# Patient Record
Sex: Female | Born: 1990 | Race: Black or African American | Hispanic: No | Marital: Single | Smoking: Never smoker
Health system: Southern US, Community
[De-identification: ages and names within clinical notes are randomized; demographics above are authoritative.]

## PROBLEM LIST (undated history)

## (undated) ENCOUNTER — Inpatient Hospital Stay (HOSPITAL_COMMUNITY): Payer: Self-pay

## (undated) DIAGNOSIS — Z789 Other specified health status: Secondary | ICD-10-CM

## (undated) HISTORY — PX: NO PAST SURGERIES: SHX2092

## (undated) HISTORY — PX: TUBAL LIGATION: SHX77

---

## 2016-05-19 ENCOUNTER — Inpatient Hospital Stay (HOSPITAL_COMMUNITY)
Admission: AD | Admit: 2016-05-19 | Discharge: 2016-05-19 | Disposition: A | Discharge status: Home / Self Care | Payer: Medicaid Other | Source: Ambulatory Visit | Attending: Obstetrics and Gynecology | Admitting: Obstetrics and Gynecology

## 2016-05-19 ENCOUNTER — Inpatient Hospital Stay (HOSPITAL_COMMUNITY): Payer: Medicaid Other

## 2016-05-19 ENCOUNTER — Encounter (HOSPITAL_COMMUNITY): Payer: Self-pay | Admitting: *Deleted

## 2016-05-19 DIAGNOSIS — Z3A01 Less than 8 weeks gestation of pregnancy: Secondary | ICD-10-CM | POA: Diagnosis not present

## 2016-05-19 DIAGNOSIS — O4691 Antepartum hemorrhage, unspecified, first trimester: Secondary | ICD-10-CM | POA: Diagnosis not present

## 2016-05-19 DIAGNOSIS — O26891 Other specified pregnancy related conditions, first trimester: Secondary | ICD-10-CM | POA: Diagnosis not present

## 2016-05-19 DIAGNOSIS — R197 Diarrhea, unspecified: Secondary | ICD-10-CM | POA: Diagnosis not present

## 2016-05-19 DIAGNOSIS — O209 Hemorrhage in early pregnancy, unspecified: Secondary | ICD-10-CM | POA: Insufficient documentation

## 2016-05-19 HISTORY — DX: Other specified health status: Z78.9

## 2016-05-19 LAB — URINALYSIS, ROUTINE W REFLEX MICROSCOPIC
Bilirubin Urine: NEGATIVE
GLUCOSE, UA: NEGATIVE mg/dL
Ketones, ur: NEGATIVE mg/dL
Nitrite: NEGATIVE
PH: 5.5 (ref 5.0–8.0)
PROTEIN: 30 mg/dL — AB

## 2016-05-19 LAB — CBC WITH DIFFERENTIAL/PLATELET
BASOS ABS: 0 10*3/uL (ref 0.0–0.1)
BASOS PCT: 0 %
EOS ABS: 0 10*3/uL (ref 0.0–0.7)
Eosinophils Relative: 1 %
HEMATOCRIT: 41.4 % (ref 36.0–46.0)
HEMOGLOBIN: 14.8 g/dL (ref 12.0–15.0)
Lymphocytes Relative: 26 %
Lymphs Abs: 1.8 10*3/uL (ref 0.7–4.0)
MCH: 33.3 pg (ref 26.0–34.0)
MCHC: 35.7 g/dL (ref 30.0–36.0)
MCV: 93.2 fL (ref 78.0–100.0)
MONOS PCT: 7 %
Monocytes Absolute: 0.4 10*3/uL (ref 0.1–1.0)
NEUTROS ABS: 4.4 10*3/uL (ref 1.7–7.7)
NEUTROS PCT: 66 %
Platelets: 247 10*3/uL (ref 150–400)
RBC: 4.44 MIL/uL (ref 3.87–5.11)
RDW: 12.5 % (ref 11.5–15.5)
WBC: 6.7 10*3/uL (ref 4.0–10.5)

## 2016-05-19 LAB — URINE MICROSCOPIC-ADD ON

## 2016-05-19 LAB — HCG, QUANTITATIVE, PREGNANCY: HCG, BETA CHAIN, QUANT, S: 16597 m[IU]/mL — AB (ref <5)

## 2016-05-19 LAB — WET PREP, GENITAL
SPERM: NONE SEEN
Trich, Wet Prep: NONE SEEN
Yeast Wet Prep HPF POC: NONE SEEN

## 2016-05-19 LAB — POCT PREGNANCY, URINE: PREG TEST UR: POSITIVE — AB

## 2016-05-19 NOTE — MAU Note (Signed)
Pt presents to MAU with complaints of vaginal spotting, dizziness, diarrhea and numbness ( entire body).

## 2016-05-19 NOTE — MAU Provider Note (Signed)
CSN: [id number]     Arrival date & time 05/19/16  1534 History   None    Chief Complaint  Patient presents with  . Diarrhea  . Vaginal Bleeding  . Dizziness  . Numbness     (Consider location/radiation/quality/duration/timing/severity/associated sxs/prior Treatment) Vaginal Bleeding The patient's primary symptoms include vaginal bleeding. This is a new problem. The current episode started yesterday. The problem occurs intermittently. The problem has been unchanged. The pain is mild. She is pregnant. Associated symptoms include abdominal pain, diarrhea and nausea. Pertinent negatives include no painful intercourse. The vaginal discharge was bloody. The vaginal bleeding is spotting. She has not been passing clots. She has not been passing tissue. She has tried nothing for the symptoms. She uses nothing for contraception.   Karen Zavala is a 25 y.o. G2P1001 @ [redacted]w[redacted]d gestation who presents to the MAU with spotting that started 4 days ago and diarrhea that started yesterday.  Current sex partner x 6 months, no hx of STI's, no BC, last pap smear one year ago and was normal.  Patient reports that the spotting started 4 days ago and has been off and on. Patient reports that she was having a problem with constipation until yesterday when she started having diarrhea. She reports having 2 watery brown stools today. Mild lower abdominal cramping.  Patient is getting PNC in [facility] but works in [place]. She has not had an ultrasound with this pregnancy.   Past Medical History  Diagnosis Date  . Medical history non-contributory    Past Surgical History  Procedure Laterality Date  . No past surgeries     Family History  Problem Relation Age of Onset  . Alcohol abuse Neg Hx    Social History  Substance Use Topics  . Smoking status: Never Smoker   . Smokeless tobacco: None  . Alcohol Use: No   OB History    Gravida Para Term Preterm AB TAB SAB Ectopic Multiple Living   Review of Systems  Gastrointestinal: Positive for nausea, abdominal pain and diarrhea.  Genitourinary: Positive for vaginal bleeding.  all other systems negative    Allergies  Review of patient's allergies indicates no known allergies.  Home Medications   Prior to Admission medications   Medication Sig Start Date End Date Taking? Authorizing Provider  Prenatal Vit-Fe Fumarate-FA (PRENATAL MULTIVITAMIN) TABS tablet Take 1 tablet by mouth daily at 12 noon.   Yes Historical Provider, MD   BP 125/71 mmHg  Pulse 68  Temp(Src) 98.7 F (37.1 C)  Resp 16  Wt 250 lb (113.399 kg)  LMP 04/06/2016 Physical Exam  Constitutional: She is oriented to person, place, and time. She appears well-developed and well-nourished. No distress.  HENT:  Head: Normocephalic.  Eyes: Conjunctivae and EOM are normal.  Neck: Normal range of motion. Neck supple.  Cardiovascular: Normal rate and regular rhythm.   Pulmonary/Chest: Effort normal and breath sounds normal.  Abdominal: Soft. Bowel sounds are normal. There is no tenderness.  Genitourinary:  External genitalia without lesions, frothy blood tinged d/c vaginal vault. Cervix inflamed, no CMT, no adnexal tenderness. Uterus slightly enlarged.   Musculoskeletal: Normal range of motion.  Neurological: She is alert and oriented to person, place, and time. No cranial nerve deficit.  Skin: Skin is warm and dry.  Psychiatric: She has a normal mood and affect. Her behavior is normal.  Nursing note and vitals reviewed.   ED Course  Procedures (including critical care time) Labs Review Labs Reviewed  WET PREP, GENITAL - Abnormal; Notable for the following:    Clue Cells Wet Prep HPF POC PRESENT (*)    WBC, Wet Prep HPF POC MODERATE (*)    All other components within normal limits  URINALYSIS, ROUTINE W REFLEX MICROSCOPIC (NOT [facility]) - Abnormal; Notable for the following:    Color, Urine AMBER (*)    Specific Gravity, Urine >1.030 (*)    Hgb urine  dipstick MODERATE (*)    Protein, ur 30 (*)    Leukocytes, UA TRACE (*)    All other components within normal limits  HCG, QUANTITATIVE, PREGNANCY - Abnormal; Notable for the following:    hCG, Beta Chain, Quant, S 16597 (*)    All other components within normal limits  URINE MICROSCOPIC-ADD ON - Abnormal; Notable for the following:    Squamous Epithelial / LPF 6-30 (*)    Bacteria, UA MANY (*)    All other components within normal limits  POCT PREGNANCY, URINE - Abnormal; Notable for the following:    Preg Test, Ur POSITIVE (*)    All other components within normal limits  CBC WITH DIFFERENTIAL/PLATELET  RPR  HIV ANTIBODY (ROUTINE TESTING)  ABO/RH  GC/CHLAMYDIA PROBE AMP ([place]) NOT AT [facility]    Imaging Review 6 week 1 day IUP with cardiac activity  I have personally reviewed and evaluated these images and lab results as part of my medical decision-making.   MDM  25 y.o. G2P1001 @ 4712w1d gestation with vaginal bleeding and diarrhea stable for d/c without acute abdomen and only scant bleeding noted. Viable IUP. Discussed with the patient clinical, lab and u/s finding and plan of care. All questioned fully answered. She will f/u with her OB. Instructions given for first trimester bleeding and diarrhea. Patient feeling better after PO hydration and crackers. No v/d during MAU visit.    Work note given.  Final diagnoses:  Vaginal bleeding in pregnancy, first trimester

## 2016-05-19 NOTE — Discharge Instructions (Signed)
Your ultrasound today shows that you are 6 weeks 1 day pregnant. Follow up with your OB for continued prenatal care.  Follow the instruction regarding vaginal bleeding in pregnancy and information on diarrhea.   Vaginal Bleeding During Pregnancy, First Trimester A small amount of bleeding (spotting) from the vagina is common in early pregnancy. Sometimes the bleeding is normal and is not a problem, and sometimes it is a sign of something serious. Be sure to tell your doctor about any bleeding from your vagina right away. HOME CARE  Watch your condition for any changes.  Follow your doctor's instructions about how active you can be.  If you are on bed rest:  You may need to stay in bed and only get up to use the bathroom.  You may be allowed to do some activities.  If you need help, make plans for someone to help you.  Write down:  The number of pads you use each day.  How often you change pads.  How soaked (saturated) your pads are.  Do not use tampons.  Do not douche.  Do not have sex or orgasms until your doctor says it is okay.  If you pass any tissue from your vagina, save the tissue so you can show it to your doctor.  Only take medicines as told by your doctor.  Do not take aspirin because it can make you bleed.  Keep all follow-up visits as told by your doctor. GET HELP IF:   You bleed from your vagina.  You have cramps.  You have labor pains.  You have a fever that does not go away after you take medicine. GET HELP RIGHT AWAY IF:   You have very bad cramps in your back or belly (abdomen).  You pass large clots or tissue from your vagina.  You bleed more.  You feel light-headed or weak.  You pass out (faint).  You have chills.  You are leaking fluid or have a gush of fluid from your vagina.  You pass out while pooping (having a bowel movement). MAKE SURE YOU:  Understand these instructions.  Will watch your condition.  Will get help right  away if you are not doing well or get worse.   This information is not intended to replace advice given to you by your health care provider. Make sure you discuss any questions you have with your health care provider.   Document Released: 04/23/2014 Document Reviewed: 04/23/2014 Elsevier Interactive Patient Education [employer]2016 Elsevier Inc.

## 2016-05-20 LAB — HIV ANTIBODY (ROUTINE TESTING W REFLEX): HIV SCREEN 4TH GENERATION: NONREACTIVE

## 2016-05-20 LAB — RPR: RPR Ser Ql: NONREACTIVE

## 2016-05-20 LAB — GC/CHLAMYDIA PROBE AMP (~~LOC~~) NOT AT ARMC
Chlamydia: NEGATIVE
Neisseria Gonorrhea: NEGATIVE

## 2016-05-20 LAB — ABO/RH: ABO/RH(D): O POS

## 2016-07-14 ENCOUNTER — Inpatient Hospital Stay (HOSPITAL_COMMUNITY)
Admission: AD | Admit: 2016-07-14 | Discharge: 2016-07-14 | Disposition: A | Discharge status: Home / Self Care | Payer: Medicaid Other | Source: Ambulatory Visit | Attending: Obstetrics & Gynecology | Admitting: Obstetrics & Gynecology

## 2016-07-14 ENCOUNTER — Encounter (HOSPITAL_COMMUNITY): Payer: Self-pay | Admitting: *Deleted

## 2016-07-14 DIAGNOSIS — N949 Unspecified condition associated with female genital organs and menstrual cycle: Secondary | ICD-10-CM | POA: Diagnosis not present

## 2016-07-14 DIAGNOSIS — Z3A14 14 weeks gestation of pregnancy: Secondary | ICD-10-CM | POA: Diagnosis not present

## 2016-07-14 DIAGNOSIS — O26851 Spotting complicating pregnancy, first trimester: Secondary | ICD-10-CM | POA: Insufficient documentation

## 2016-07-14 DIAGNOSIS — O26891 Other specified pregnancy related conditions, first trimester: Secondary | ICD-10-CM | POA: Insufficient documentation

## 2016-07-14 DIAGNOSIS — O9989 Other specified diseases and conditions complicating pregnancy, childbirth and the puerperium: Secondary | ICD-10-CM

## 2016-07-14 DIAGNOSIS — R102 Pelvic and perineal pain: Secondary | ICD-10-CM | POA: Insufficient documentation

## 2016-07-14 LAB — URINALYSIS, ROUTINE W REFLEX MICROSCOPIC
Bilirubin Urine: NEGATIVE
Glucose, UA: NEGATIVE mg/dL
HGB URINE DIPSTICK: NEGATIVE
Ketones, ur: NEGATIVE mg/dL
Nitrite: NEGATIVE
PROTEIN: NEGATIVE mg/dL
pH: 6 (ref 5.0–8.0)

## 2016-07-14 LAB — WET PREP, GENITAL
SPERM: NONE SEEN
TRICH WET PREP: NONE SEEN
YEAST WET PREP: NONE SEEN

## 2016-07-14 LAB — URINE MICROSCOPIC-ADD ON

## 2016-07-14 MED ORDER — IBUPROFEN 800 MG PO TABS
800.0000 mg | ORAL_TABLET | Freq: Once | ORAL | Status: AC
  Administered 2016-07-14: 800 mg via ORAL
  Filled 2016-07-14: qty 1

## 2016-07-14 NOTE — MAU Note (Signed)
PT SAYS   AT 0900- SHE  STARTED FEELING  CRAMPING-  THEN  SPOTTING - WHEN  SHE  WIPED-  LIGHT  PINK.  Marland Kitchen   CRAMPING  WORSE AT 730PM-     AND  BACK HURTS.      LESS SPOTTING- SOME  BROWN.      LAST SEX-   2  MTHS   AGO.     PNC-       [place]    -    WORKS  [place]ERE - IN [place].

## 2016-07-14 NOTE — MAU Provider Note (Signed)
History     CSN: [id number]  Arrival date and time: 07/14/16 2040   First Provider Initiated Contact with Patient 07/14/16 2145      Chief Complaint  Patient presents with  . Pelvic Pain   Pelvic Pain  The patient's primary symptoms include pelvic pain and vaginal bleeding. This is a new problem. The current episode started today. The problem occurs intermittently. The problem has been unchanged. Pain severity now: 5/10  The problem affects both sides. She is pregnant. Associated symptoms include abdominal pain. Pertinent negatives include no chills, constipation, diarrhea, dysuria, fever, frequency, nausea, urgency or vomiting. The vaginal discharge was normal. The vaginal bleeding is spotting (Had spotting this morning, but it has since stopped. ). She has not been passing clots. She has not been passing tissue. The symptoms are aggravated by activity. She has tried nothing for the symptoms. Her menstrual history has been regular (LMP 04/06/16 ).     Past Medical History:  Diagnosis Date  . Medical history non-contributory     Past Surgical History:  Procedure Laterality Date  . NO PAST SURGERIES      Family History  Problem Relation Age of Onset  . Alcohol abuse Neg Hx     Social History  Substance Use Topics  . Smoking status: Never Smoker  . Smokeless tobacco: Never Used  . Alcohol use No    Allergies: No Known Allergies  Prescriptions Prior to Admission  Medication Sig Dispense Refill Last Dose  . Prenatal Vit-Fe Fumarate-FA (PRENATAL MULTIVITAMIN) TABS tablet Take 1 tablet by mouth daily at 12 noon.   07/14/2016 at Unknown time    Review of Systems  Constitutional: Negative for chills and fever.  Gastrointestinal: Positive for abdominal pain. Negative for constipation, diarrhea, nausea and vomiting.  Genitourinary: Positive for pelvic pain. Negative for dysuria, frequency and urgency.   Physical Exam   Blood pressure 101/56, pulse 64, temperature 99.1 F  (37.3 C), temperature source Oral, resp. rate 20, height 5\' 7"  (1.702 m), weight 251 lb 12 oz (114.2 kg), last menstrual period 04/06/2016.  Physical Exam  Nursing note and vitals reviewed. Constitutional: She is oriented to person, place, and time. She appears well-developed and well-nourished. No distress.  HENT:  Head: Normocephalic.  Cardiovascular: Normal rate.   Respiratory: Effort normal.  GI: Soft. There is no tenderness. There is no rebound.  Genitourinary:  Genitourinary Comments: Cervix: closed/thick FHT 155 with doppler   Neurological: She is alert and oriented to person, place, and time.  Skin: Skin is warm and dry.  Psychiatric: She has a normal mood and affect.   Results for orders placed or performed during the hospital encounter of 07/14/16 (from the past 24 hour(s))  Urinalysis, Routine w reflex microscopic (not at [facility])     Status: Abnormal   Collection Time: 07/14/16  9:13 PM  Result Value Ref Range   Color, Urine YELLOW YELLOW   APPearance CLEAR CLEAR   Specific Gravity, Urine >1.030 (H) 1.005 - 1.030   pH 6.0 5.0 - 8.0   Glucose, UA NEGATIVE NEGATIVE mg/dL   Hgb urine dipstick NEGATIVE NEGATIVE   Bilirubin Urine NEGATIVE NEGATIVE   Ketones, ur NEGATIVE NEGATIVE mg/dL   Protein, ur NEGATIVE NEGATIVE mg/dL   Nitrite NEGATIVE NEGATIVE   Leukocytes, UA SMALL (A) NEGATIVE  Urine microscopic-add on     Status: Abnormal   Collection Time: 07/14/16  9:13 PM  Result Value Ref Range   Squamous Epithelial / LPF 0-5 (A) NONE SEEN  WBC, UA 0-5 0 - 5 WBC/hpf   RBC / HPF 0-5 0 - 5 RBC/hpf   Bacteria, UA FEW (A) NONE SEEN  Wet prep, genital     Status: Abnormal   Collection Time: 07/14/16  9:50 PM  Result Value Ref Range   Yeast Wet Prep HPF POC NONE SEEN NONE SEEN   Trich, Wet Prep NONE SEEN NONE SEEN   Clue Cells Wet Prep HPF POC PRESENT (A) NONE SEEN   WBC, Wet Prep HPF POC FEW (A) NONE SEEN   Sperm NONE SEEN     MAU Course  Procedures  MDM Patient has  had ibuprofen. She reports that her pain is better.   Assessment and Plan   1. Round ligament pain   2. [redacted] weeks gestation of pregnancy    DC home Comfort measures reviewed  2nd Trimester precautions  RX: none  Return to MAU as needed FU with OB as planned  Follow-up Information    [facility] .   Specialty:  Obstetrics and Gynecology Why:  As scheduled  Contact information: [address] [place] [address] [phone]           Tawnya Crook 07/14/2016, 9:46 PM

## 2016-07-14 NOTE — Discharge Instructions (Signed)

## 2016-07-15 LAB — GC/CHLAMYDIA PROBE AMP (~~LOC~~) NOT AT ARMC
Chlamydia: NEGATIVE
Neisseria Gonorrhea: NEGATIVE

## 2016-10-16 ENCOUNTER — Encounter (HOSPITAL_BASED_OUTPATIENT_CLINIC_OR_DEPARTMENT_OTHER): Payer: Self-pay | Admitting: *Deleted

## 2016-10-16 ENCOUNTER — Emergency Department (HOSPITAL_BASED_OUTPATIENT_CLINIC_OR_DEPARTMENT_OTHER)
Admission: EM | Admit: 2016-10-16 | Discharge: 2016-10-16 | Disposition: A | Discharge status: Home / Self Care | Payer: Medicaid Other | Attending: Emergency Medicine | Admitting: Emergency Medicine

## 2016-10-16 DIAGNOSIS — Z3A28 28 weeks gestation of pregnancy: Secondary | ICD-10-CM | POA: Diagnosis not present

## 2016-10-16 DIAGNOSIS — N760 Acute vaginitis: Secondary | ICD-10-CM

## 2016-10-16 DIAGNOSIS — O2313 Infections of bladder in pregnancy, third trimester: Secondary | ICD-10-CM | POA: Diagnosis not present

## 2016-10-16 DIAGNOSIS — O23599 Infection of other part of genital tract in pregnancy, unspecified trimester: Secondary | ICD-10-CM

## 2016-10-16 DIAGNOSIS — O23593 Infection of other part of genital tract in pregnancy, third trimester: Secondary | ICD-10-CM | POA: Insufficient documentation

## 2016-10-16 DIAGNOSIS — N3000 Acute cystitis without hematuria: Secondary | ICD-10-CM

## 2016-10-16 LAB — URINALYSIS, ROUTINE W REFLEX MICROSCOPIC
Bilirubin Urine: NEGATIVE
GLUCOSE, UA: NEGATIVE mg/dL
Hgb urine dipstick: NEGATIVE
KETONES UR: NEGATIVE mg/dL
NITRITE: POSITIVE — AB
PROTEIN: NEGATIVE mg/dL
Specific Gravity, Urine: 1.015 (ref 1.005–1.030)
pH: 7 (ref 5.0–8.0)

## 2016-10-16 LAB — URINE MICROSCOPIC-ADD ON

## 2016-10-16 MED ORDER — CEPHALEXIN 500 MG PO CAPS: 500.0000 mg | ORAL_CAPSULE | Freq: Two times a day (BID) | ORAL | 0 refills | Status: DC

## 2016-10-16 MED ORDER — CLOTRIMAZOLE 1 % VA CREA: 1.0000 | TOPICAL_CREAM | Freq: Every day | VAGINAL | 0 refills | Status: DC

## 2016-10-16 NOTE — ED Notes (Signed)
PA at bedside.

## 2016-10-16 NOTE — ED Triage Notes (Signed)
Pt reports 4-5 days of vaginal rectal irritation. States it burns when she urinates and urine touches the skin. Reports vaginal irritation to her labia. Pt is 6-7 months pregnant. Denies prior similar symptoms

## 2016-10-16 NOTE — ED Provider Notes (Signed)
[facility] Provider Note   CSN: [id number] Arrival date & time: 10/16/16  1649  By signing my name below, I, Christy Sartorius, attest that this documentation has been prepared under the direction and in the presence of  Aviva Wolfer Camprubi-Soms, PA-C. Electronically Signed: Christy Sartorius, ED Scribe. 10/16/16. 5:37 PM.  History   Chief Complaint Chief Complaint  Patient presents with  . Vaginal Itching   The history is provided by the patient and medical records. No language interpreter was used.  Vaginal Itching  This is a new problem. The current episode started more than 2 days ago. The problem occurs constantly. The problem has not changed since onset.Pertinent negatives include no chest pain, no abdominal pain and no shortness of breath. Exacerbated by: urination. Nothing relieves the symptoms. She has tried nothing for the symptoms. The treatment provided no relief.    HPI Comments:  Karen Zavala is a 25 y.o. female currently almost [redacted] weeks pregnant receiving prenatal care at [facility], who presents to the Emergency Department complaining of vaginal pruritis that begins on her labia majora and goes towards her rectum ongoing x1 wk.  Pt states her symptoms began after using lavare soap 1 week ago.  Pt has returned to her normal soap but reports her itching persists.  She notes some irritation of the area with urination due to the urine touching her skin, but denies dysuria.  She denies vaginal discharge/bleeding, genital sores, dysuria, hematuria, fever, chills, chest pain, SOB, abdominal pain, n/v/d/c, numbness, tingling and weakness.  Pt is sexually active, unprotected, with one female partner. She denies use of new condoms or feminine products and recent antibiotics. LMP was 04/06/16.  Past Medical History:  Diagnosis Date  . Medical history non-contributory     There are no active problems to display for this patient.   Past Surgical History:  Procedure  Laterality Date  . NO PAST SURGERIES      OB History    Gravida Para Term Preterm AB Living   2 1 1     1    SAB TAB Ectopic Multiple Live Births           1       Home Medications    Prior to Admission medications   Medication Sig Start Date End Date Taking? Authorizing Provider  Prenatal Vit-Fe Fumarate-FA (PRENATAL MULTIVITAMIN) TABS tablet Take 1 tablet by mouth daily at 12 noon.    Historical Provider, MD    Family History Family History  Problem Relation Age of Onset  . Alcohol abuse Neg Hx     Social History Social History  Substance Use Topics  . Smoking status: Never Smoker  . Smokeless tobacco: Never Used  . Alcohol use No     Allergies   Review of patient's allergies indicates no known allergies.   Review of Systems Review of Systems  Constitutional: Negative for chills and fever.  Respiratory: Negative for shortness of breath.   Cardiovascular: Negative for chest pain.  Gastrointestinal: Negative for abdominal pain, constipation, diarrhea, nausea and vomiting.  Genitourinary: Positive for vaginal pain (itching). Negative for dysuria, genital sores, hematuria, vaginal bleeding and vaginal discharge.  Musculoskeletal: Negative for arthralgias and myalgias.  Skin: Negative for color change.  Allergic/Immunologic: Negative for immunocompromised state.  Neurological: Negative for weakness and numbness.  Psychiatric/Behavioral: Negative for confusion.  10 Systems reviewed and are negative for acute change except as noted in the HPI.    Physical Exam Updated Vital Signs BP 119/71  Pulse 79   Temp 97.9 F (36.6 C) (Oral)   Resp 18   Ht 5\' 7"  (1.702 m)   Wt 260 lb (117.9 kg)   LMP 04/06/2016   SpO2 100%   BMI 40.72 kg/m   Physical Exam  Constitutional: She is oriented to person, place, and time. Vital signs are normal. She appears well-developed and well-nourished.  Non-toxic appearance. No distress.  Afebrile, nontoxic, NAD  HENT:  Head:  Normocephalic and atraumatic.  Mouth/Throat: Oropharynx is clear and moist and mucous membranes are normal.  Eyes: Conjunctivae and EOM are normal. Right eye exhibits no discharge. Left eye exhibits no discharge.  Neck: Normal range of motion. Neck supple.  Cardiovascular: Normal rate, regular rhythm, normal heart sounds and intact distal pulses.  Exam reveals no gallop and no friction rub.   No murmur heard. Pulmonary/Chest: Effort normal and breath sounds normal. No respiratory distress. She has no decreased breath sounds. She has no wheezes. She has no rhonchi. She has no rales.  Abdominal: Soft. Normal appearance and bowel sounds are normal. She exhibits no distension. There is no tenderness. There is no rigidity, no rebound, no guarding, no CVA tenderness, no tenderness at McBurney's point and negative Murphy's sign.  Soft, gravid to the level above the umbilicus, NTND, +BS throughout, no r/g/r, neg murphy's, neg mcburney's, no CVA TTP.  Musculoskeletal: Normal range of motion.  Neurological: She is alert and oriented to person, place, and time. She has normal strength. No sensory deficit.  Skin: Skin is warm, dry and intact. No rash noted.  Psychiatric: She has a normal mood and affect.  Nursing note and vitals reviewed.   ED Treatments / Results   DIAGNOSTIC STUDIES:  Oxygen Saturation is 100% on RA, NML by my interpretation.    COORDINATION OF CARE:  5:34 PM Discussed treatment plan with pt at bedside and pt agreed to plan.  Labs (all labs ordered are listed, but only abnormal results are displayed) Labs Reviewed  URINALYSIS, ROUTINE W REFLEX MICROSCOPIC (NOT AT [facility]) - Abnormal; Notable for the following:       Result Value   APPearance CLOUDY (*)    Nitrite POSITIVE (*)    Leukocytes, UA LARGE (*)    All other components within normal limits  URINE MICROSCOPIC-ADD ON - Abnormal; Notable for the following:    Squamous Epithelial / LPF 0-5 (*)    Bacteria, UA MANY (*)     All other components within normal limits  URINE CULTURE    EKG  EKG Interpretation None       Radiology No results found.  Procedures Procedures (including critical care time)  Medications Ordered in ED Medications - No data to display   Initial Impression / Assessment and Plan / ED Course  I have reviewed the triage vital signs and the nursing notes.  Pertinent labs & imaging results that were available during my care of the patient were reviewed by me and considered in my medical decision making (see chart for details).  Clinical Course    25 y.o. female here with vulvar irritation since switching soaps. No discharge, no abd pain/tenderness. Pt is [redacted]wks pregnant, receiving prenatal care [facility]. Discussed that given that she's pregnant, want to minimize vaginal exams, therefore will empirically cover with clotimazole vaginal cream for yeast vaginitis given symptoms, but will not obtain pelvic exam (especially given lack of abdominal pain and discharge). Will get U/A to ensure no UTI or other complicating factor, and  then reassess shortly.  6:04 PM U/A nitrite+ with large leuks, 0-5 squamous, TNTC WBC, and many bacteria, consistent with UTI. Will treat with keflex x7 days; culture sent. Will also send home with rx for gyne-lotrimin. Discussed switching back to old soap. F/up with OBGYN in 3-5 days for recheck. Sitz baths discussed. I explained the diagnosis and have given explicit precautions to return to the ER including for any other new or worsening symptoms. The patient understands and accepts the medical plan as it's been dictated and I have answered their questions. Discharge instructions concerning home care and prescriptions have been given. The patient is STABLE and is discharged to home in good condition.   Final Clinical Impressions(s) / ED Diagnoses   Final diagnoses:  Vaginitis complicating current pregnancy  Acute cystitis without hematuria     New Prescriptions New Prescriptions   CEPHALEXIN (KEFLEX) 500 MG CAPSULE    Take 1 capsule (500 mg total) by mouth 2 (two) times daily. x 7 days   CLOTRIMAZOLE (GYNE-LOTRIMIN) 1 % VAGINAL CREAM    Place 1 Applicatorful vaginally at bedtime. X 7 days   I personally performed the services described in this documentation, which was scribed in my presence. The recorded information has been reviewed and is accurate.     Allen Derry, PA-C 10/16/16 1805    Laurence Spates, MD 10/21/16 [phone]

## 2016-10-16 NOTE — Discharge Instructions (Addendum)
Use clotrimazole cream as directed for 1 week to treat a vaginal yeast infection. Switch back to your prior soap to avoid continued irritation. Perform warm sitz baths to help with irritation of the vaginal area.  You have a urinary tract infection. Stay very well hydrated with plenty of water throughout the day. Take antibiotic until completed. Use tylenol as needed for pain. Follow up with your OBGYN in the next 5 days for recheck of symptoms. [facility] for changes or worsening symptoms.  Please seek immediate care if you develop the following: You develop back pain.  Your symptoms are no better, or worse in 3 days. There is severe back pain or lower abdominal pain.  You develop chills.  You have a fever.  There is nausea or vomiting.  There is continued burning or discomfort with urination.

## 2016-10-18 LAB — URINE CULTURE: Culture: >=100000 — AB

## 2016-10-19 ENCOUNTER — Telehealth (HOSPITAL_BASED_OUTPATIENT_CLINIC_OR_DEPARTMENT_OTHER): Payer: Self-pay | Admitting: Emergency Medicine

## 2016-10-19 NOTE — Telephone Encounter (Signed)
Post ED Visit - Positive Culture Follow-up  Culture report reviewed by antimicrobial stewardship pharmacist:  []  Enzo BiNathan Batchelder, Pharm.D. []  Celedonio MiyamotoJeremy Frens, [address] BCPS []  Garvin FilaMike Maccia, Pharm.D. []  Georgina PillionElizabeth Martin, Pharm.D., BCPS []  EkalakaMinh Pham, [address]D., BCPS, AAHIVP []  Estella HuskMichelle Turner, Pharm.D., BCPS, AAHIVP []  Tennis Mustassie Stewart, Pharm.D. []  Sherle Poeob Vincent, [address]Lonna Cobb. T[address] Stone PharmD  Positive urine culture Treated with cephalexin, organism sensitive to the same and no further patient follow-up is required at this time.  Berle MullMiller, Anees Vanecek 10/19/2016, 10:07 AM

## 2016-11-22 ENCOUNTER — Inpatient Hospital Stay (HOSPITAL_COMMUNITY)
Admission: AD | Admit: 2016-11-22 | Discharge: 2016-11-22 | Disposition: A | Discharge status: Home / Self Care | Payer: Medicaid Other | Source: Ambulatory Visit | Attending: Obstetrics & Gynecology | Admitting: Obstetrics & Gynecology

## 2016-11-22 ENCOUNTER — Encounter (HOSPITAL_COMMUNITY): Payer: Self-pay

## 2016-11-22 DIAGNOSIS — Z3A32 32 weeks gestation of pregnancy: Secondary | ICD-10-CM | POA: Diagnosis not present

## 2016-11-22 DIAGNOSIS — O26893 Other specified pregnancy related conditions, third trimester: Secondary | ICD-10-CM | POA: Diagnosis not present

## 2016-11-22 DIAGNOSIS — R109 Unspecified abdominal pain: Secondary | ICD-10-CM | POA: Diagnosis not present

## 2016-11-22 LAB — URINALYSIS, ROUTINE W REFLEX MICROSCOPIC
Bilirubin Urine: NEGATIVE
Glucose, UA: NEGATIVE mg/dL
HGB URINE DIPSTICK: NEGATIVE
Ketones, ur: NEGATIVE mg/dL
Leukocytes, UA: NEGATIVE
NITRITE: NEGATIVE
PROTEIN: NEGATIVE mg/dL
SPECIFIC GRAVITY, URINE: 1.025 (ref 1.005–1.030)
pH: 6.5 (ref 5.0–8.0)

## 2016-11-22 NOTE — MAU Provider Note (Signed)
History   G2P1001 @ 32.6 wks in with c/o abd pain and cramping. [facility] [place] in [place] so she came here. States good fetal movement.  CSN: [id number]  Arrival date & time 11/22/16  1657   None     Chief Complaint  Patient presents with  . Abdominal Pain  . Back Pain  . pelvic pressure    HPI  Past Medical History:  Diagnosis Date  . Medical history non-contributory     Past Surgical History:  Procedure Laterality Date  . NO PAST SURGERIES      Family History  Problem Relation Age of Onset  . Alcohol abuse Neg Hx     Social History  Substance Use Topics  . Smoking status: Never Smoker  . Smokeless tobacco: Never Used  . Alcohol use No    OB History    Gravida Para Term Preterm AB Living   2 1 1     1    SAB TAB Ectopic Multiple Live Births           1      Review of Systems  Constitutional: Negative.   HENT: Negative.   Eyes: Negative.   Respiratory: Negative.   Cardiovascular: Negative.   Gastrointestinal: Positive for abdominal pain.  Endocrine: Negative.   Genitourinary: Negative.   Musculoskeletal: Negative.   Skin: Negative.   Allergic/Immunologic: Negative.   Neurological: Negative.   Hematological: Negative.   Psychiatric/Behavioral: Negative.     Allergies  Patient has no known allergies.  Home Medications    BP 126/65 (BP Location: Right Arm)   Pulse 86   Temp 98 F (36.7 C) (Oral)   Resp 18   LMP 04/06/2016   Physical Exam  Constitutional: She is oriented to person, place, and time. She appears well-developed and well-nourished.  HENT:  Head: Normocephalic.  Eyes: Pupils are equal, round, and reactive to light.  Neck: Normal range of motion.  Cardiovascular: Normal rate, regular rhythm, normal heart sounds and intact distal pulses.   Pulmonary/Chest: Effort normal and breath sounds normal.  Abdominal: Soft. Bowel sounds are normal.  Genitourinary: Vagina normal and uterus normal.   Musculoskeletal: Normal range of motion.  Neurological: She is alert and oriented to person, place, and time. She has normal reflexes.  Skin: Skin is warm and dry.  Psychiatric: She has a normal mood and affect. Her behavior is normal. Judgment and thought content normal.    MAU Course  Procedures (including critical care time)  Labs Reviewed  URINALYSIS, ROUTINE W REFLEX MICROSCOPIC (NOT AT [facility])   No results found.   No diagnosis found.    MDM  Fetal heart rate pattern reassuring, SVE post/th/ft/blts. Pt states she was a FT last week in office which is no change. No uc's. Urinalysis WNL will d/c home to f/u with provider as needed.

## 2016-11-22 NOTE — MAU Note (Signed)
Pt C/O lower back pain, lower abdominal cramping, pelvic pressure for the last 2-3 days.  Had pos "preterm labor test" on Wednesday.  Received BMZ on Thursday & Friday.  Was dilated 1 dm on Thursday.  [facility].  Denies bleeding or LOF today.

## 2016-12-10 ENCOUNTER — Encounter (HOSPITAL_COMMUNITY): Payer: Self-pay | Admitting: *Deleted

## 2016-12-10 ENCOUNTER — Inpatient Hospital Stay (HOSPITAL_COMMUNITY)
Admission: AD | Admit: 2016-12-10 | Discharge: 2016-12-10 | Disposition: A | Discharge status: Home / Self Care | Payer: Medicaid Other | Source: Ambulatory Visit | Attending: Obstetrics & Gynecology | Admitting: Obstetrics & Gynecology

## 2016-12-10 DIAGNOSIS — Z3A35 35 weeks gestation of pregnancy: Secondary | ICD-10-CM | POA: Diagnosis not present

## 2016-12-10 DIAGNOSIS — O26893 Other specified pregnancy related conditions, third trimester: Secondary | ICD-10-CM | POA: Insufficient documentation

## 2016-12-10 DIAGNOSIS — N898 Other specified noninflammatory disorders of vagina: Secondary | ICD-10-CM | POA: Insufficient documentation

## 2016-12-10 LAB — WET PREP, GENITAL
Clue Cells Wet Prep HPF POC: NONE SEEN
Sperm: NONE SEEN
Trich, Wet Prep: NONE SEEN
Yeast Wet Prep HPF POC: NONE SEEN

## 2016-12-10 LAB — POCT FERN TEST: POCT FERN TEST: NEGATIVE

## 2016-12-10 NOTE — Discharge Instructions (Signed)

## 2016-12-10 NOTE — MAU Provider Note (Signed)
History     CSN: [id number]  Arrival date and time: 12/10/16 [id number]  Discharge    Chief Complaint  Patient presents with  . Rupture of Membranes   Patient is a 25 year old G2 P1 at 35 weeks and 3 days who presents today with concern for ruptured membranes. She had initial wet prep performed by the nurse is negative and I have been called to evaluate further for rupture membrane. She reports that she woke up this morning her panties were moist. She put a new pair on and it too was moist. She reports its a clear is discharge. No foul odor. She denies any abdominal pain. She denies any bleeding. She denies contractions.    OB History    Gravida Para Term Preterm AB Living   2 1 1     1    SAB TAB Ectopic Multiple Live Births           1      Past Medical History:  Diagnosis Date  . Medical history non-contributory     Past Surgical History:  Procedure Laterality Date  . NO PAST SURGERIES      Family History  Problem Relation Age of Onset  . Alcohol abuse Neg Hx     Social History  Substance Use Topics  . Smoking status: Never Smoker  . Smokeless tobacco: Never Used  . Alcohol use No    Allergies: No Known Allergies  Prescriptions Prior to Admission  Medication Sig Dispense Refill Last Dose  . cephALEXin (KEFLEX) 500 MG capsule Take 1 capsule (500 mg total) by mouth 2 (two) times daily. x 7 days (Patient not taking: Reported on 12/10/2016) 14 capsule 0 Completed Course at Unknown time  . clotrimazole (GYNE-LOTRIMIN) 1 % vaginal cream Place 1 Applicatorful vaginally at bedtime. X 7 days (Patient not taking: Reported on 12/10/2016) 45 g 0 Completed Course at Unknown time    Review of Systems  Constitutional: Negative for chills and fever.  Respiratory: Negative for cough and shortness of breath.   Cardiovascular: Negative for chest pain and palpitations.  Gastrointestinal: Negative for abdominal pain, diarrhea, heartburn, nausea and vomiting.  Genitourinary:  Negative for dysuria, frequency and urgency.   Physical Exam   Blood pressure 100/57, pulse 80, resp. rate 18, height 5\' 7"  (1.702 m), weight 276 lb 1.9 oz (125.2 kg), last menstrual period 04/06/2016.  Physical Exam  Constitutional: She is oriented to person, place, and time. She appears well-developed and well-nourished.  Cardiovascular: Normal rate and intact distal pulses.   Respiratory: Effort normal. No respiratory distress.  GI: Soft. Bowel sounds are normal. She exhibits no distension. There is no tenderness. There is no rebound.  Genitourinary:  Genitourinary Comments: Copious clear discharge in the vagina, healthy pink mucosa, cervix visually closed.  Neurological: She is alert and oriented to person, place, and time.  Skin: Skin is warm and dry.  Psychiatric: She has a normal mood and affect.    MAU Course  Procedures  MDM In MA U patient underwent continuous fetal monitoring which showed a reactive NST with a heart rate of 145. No contractions were picked up on tocometry. Patient had a wet prep which revealed no abnormalities. Gonorrhea and chlamydia was sent. Denies any urinary symptoms. Patient's cervix was checked and is 1.5/thick/ -3  Assessment and Plan  #1: Vaginal discharge: Likely physiologic reassured patient's it does not appear to be amniotic fluid nor bacterial vaginosis infection. Will await the results of final gonorrhea and chlamydia. Okay  to DC home.  Ernestina Pennaicholas Schenk 12/10/2016, 11:57 AM

## 2016-12-10 NOTE — MAU Note (Signed)
Pt stated when she woke up this morning her underwear was wet. Pt c/o lower back pain and period like painas well. Good fetal movement. Gets her care in [place].

## 2016-12-11 LAB — GC/CHLAMYDIA PROBE AMP (~~LOC~~) NOT AT ARMC
CHLAMYDIA, DNA PROBE: NEGATIVE
NEISSERIA GONORRHEA: NEGATIVE

## 2017-03-24 ENCOUNTER — Encounter (HOSPITAL_COMMUNITY): Payer: Self-pay

## 2017-06-18 ENCOUNTER — Emergency Department (HOSPITAL_BASED_OUTPATIENT_CLINIC_OR_DEPARTMENT_OTHER): Payer: BLUE CROSS/BLUE SHIELD

## 2017-06-18 ENCOUNTER — Emergency Department (HOSPITAL_BASED_OUTPATIENT_CLINIC_OR_DEPARTMENT_OTHER)
Admission: EM | Admit: 2017-06-18 | Discharge: 2017-06-18 | Disposition: A | Discharge status: Home / Self Care | Payer: BLUE CROSS/BLUE SHIELD | Attending: Emergency Medicine | Admitting: Emergency Medicine

## 2017-06-18 ENCOUNTER — Encounter (HOSPITAL_BASED_OUTPATIENT_CLINIC_OR_DEPARTMENT_OTHER): Payer: Self-pay | Admitting: Emergency Medicine

## 2017-06-18 DIAGNOSIS — N83201 Unspecified ovarian cyst, right side: Secondary | ICD-10-CM | POA: Diagnosis not present

## 2017-06-18 DIAGNOSIS — Z975 Presence of (intrauterine) contraceptive device: Secondary | ICD-10-CM | POA: Insufficient documentation

## 2017-06-18 DIAGNOSIS — R109 Unspecified abdominal pain: Secondary | ICD-10-CM | POA: Diagnosis present

## 2017-06-18 LAB — URINALYSIS, ROUTINE W REFLEX MICROSCOPIC
BILIRUBIN URINE: NEGATIVE
Glucose, UA: NEGATIVE mg/dL
Hgb urine dipstick: NEGATIVE
KETONES UR: NEGATIVE mg/dL
Leukocytes, UA: NEGATIVE
NITRITE: NEGATIVE
Protein, ur: NEGATIVE mg/dL
Specific Gravity, Urine: 1.029 (ref 1.005–1.030)
pH: 5.5 (ref 5.0–8.0)

## 2017-06-18 LAB — WET PREP, GENITAL
Sperm: NONE SEEN
TRICH WET PREP: NONE SEEN
Yeast Wet Prep HPF POC: NONE SEEN

## 2017-06-18 LAB — PREGNANCY, URINE: Preg Test, Ur: NEGATIVE

## 2017-06-18 MED ORDER — KETOROLAC TROMETHAMINE 60 MG/2ML IM SOLN
60.0000 mg | Freq: Once | INTRAMUSCULAR | Status: AC
  Administered 2017-06-18: 60 mg via INTRAMUSCULAR
  Filled 2017-06-18: qty 2

## 2017-06-18 MED ORDER — DICYCLOMINE HCL 10 MG PO CAPS
10.0000 mg | ORAL_CAPSULE | Freq: Once | ORAL | Status: AC
  Administered 2017-06-18: 10 mg via ORAL
  Filled 2017-06-18: qty 1

## 2017-06-18 NOTE — ED Notes (Addendum)
Patient is not in the room at this time.   She has been wanting to go home before I gave her her med,  Bentyl.  Patient left prior to AVS.

## 2017-06-18 NOTE — ED Notes (Signed)
Patient seen walking down the hallway and not stopping for assistance. Patient did not stop at registration nor as she was called

## 2017-06-18 NOTE — ED Provider Notes (Signed)
[facility]-EMERGENCY DEPT Provider Note   CSN: [id number] Arrival date & time: 06/18/17  1007     History   Chief Complaint Chief Complaint  Patient presents with  . Abdominal Cramping    HPI Karen Zavala is a 26 y.o. female who presents with 2 days of Gradually worsening abdominal cramping. Patient states that the pain originates in her lower pelvic and radiates to her abdomen bilaterally. Patient states that she has been taking ibuprofen with mild temporary relief but states that the cramping returns. She has not tried any other medications or home remedies. She denies any other alleviating factor. She denies any aggravating factors. Patient states that she describes similar episode of symptoms therefore rescheduled within her OB/GYN at that time. Patient had pelvic done revealing no STDs. Patient reports her last menstrual cycle was 05/21/17. Patient is currently sexually active with one partner and does not use protection. She denies any history of STD infections. Patient denies any fever, nausea/vomiting, chest pain, difficulty breathing, diarrhea, constipation, dysuria, hematuria, vaginal bleeding, vaginal discharge.  The history is provided by the patient.    Past Medical History:  Diagnosis Date  . Medical history non-contributory     There are no active problems to display for this patient.   Past Surgical History:  Procedure Laterality Date  . NO PAST SURGERIES      OB History    Gravida Para Term Preterm AB Living   2 1 1     1    SAB TAB Ectopic Multiple Live Births           1       Home Medications    Prior to Admission medications   Not on File    Family History Family History  Problem Relation Age of Onset  . Alcohol abuse Neg Hx     Social History Social History  Substance Use Topics  . Smoking status: Never Smoker  . Smokeless tobacco: Never Used  . Alcohol use No     Allergies   Patient has no known allergies.   Review of Systems Review  of Systems  Constitutional: Negative for fever.  Respiratory: Negative for cough and shortness of breath.   Cardiovascular: Negative for chest pain.  Gastrointestinal: Positive for abdominal pain. Negative for constipation, diarrhea, nausea and vomiting.  Genitourinary: Negative for dysuria and hematuria.  Musculoskeletal: Negative for back pain.  Neurological: Negative for headaches.     Physical Exam Updated Vital Signs BP 110/68 (BP Location: Right Arm)   Pulse 60   Temp 98.4 F (36.9 C) (Oral)   Resp 18   Ht 5\' 7"  (1.702 m)   Wt 123 kg (271 lb 2.7 oz)   LMP 05/21/2017 (LMP Unknown)   SpO2 100%   BMI 42.47 kg/m   Physical Exam  Constitutional: She is oriented to person, place, and time. She appears well-developed and well-nourished.  Sitting comfortably on examination table  HENT:  Head: Normocephalic and atraumatic.  Mouth/Throat: Oropharynx is clear and moist and mucous membranes are normal.  Eyes: Conjunctivae, EOM and lids are normal. Pupils are equal, round, and reactive to light.  Neck: Full passive range of motion without pain.  Cardiovascular: Normal rate, regular rhythm, normal heart sounds and normal pulses.  Exam reveals no gallop and no friction rub.   No murmur heard. Pulmonary/Chest: Effort normal and breath sounds normal.  Abdominal: Soft. Normal appearance and bowel sounds are normal. There is generalized tenderness. There is no rigidity, no guarding and  no CVA tenderness.  Abdomen soft, with no distention. She has generalized tenderness to the entire abdomen. No focal tenderness. CVA tenderness bilaterally.  Genitourinary: Vagina normal and uterus normal. Cervix exhibits no motion tenderness and no friability. Right adnexum displays no mass and no tenderness. Left adnexum displays no mass.  Genitourinary Comments: The exam was performed with a chaperone present. Normal external female genitalia with no external lesions or rashes. Patient has some very small  amount of blood tinged mucus in vaginal canal that may be indicative the patient is getting her period. No CMT. No adnexal mass or tenderness noted. No uterine tenderness.  Musculoskeletal: Normal range of motion.  Neurological: She is alert and oriented to person, place, and time.  Skin: Skin is warm and dry. Capillary refill takes less than 2 seconds.  Psychiatric: She has a normal mood and affect. Her speech is normal.  Nursing note and vitals reviewed.    ED Treatments / Results  Labs (all labs ordered are listed, but only abnormal results are displayed) Labs Reviewed  WET PREP, GENITAL - Abnormal; Notable for the following:       Result Value   Clue Cells Wet Prep HPF POC PRESENT (*)    WBC, Wet Prep HPF POC MANY (*)    All other components within normal limits  PREGNANCY, URINE  URINALYSIS, ROUTINE W REFLEX MICROSCOPIC  GC/CHLAMYDIA PROBE AMP ([place]) NOT AT [facility]    EKG  EKG Interpretation None       Radiology US Transvaginal Non-ob  Result Date: 06/18/2017 CLINICAL DATA:  26 year old female with 2 days of generalized pelvic pain and cramping. IUD placed February 2018. Uncertain LMP. EXAM: TRANSABDOMINAL AND TRANSVAGINAL ULTRASOUND OF PELVIS DOPPLER ULTRASOUND OF OVARIES TECHNIQUE: Both transabdominal and transvaginal ultrasound examinations of the pelvis were performed. Transabdominal technique was performed for global imaging of the pelvis including uterus, ovaries, adnexal regions, and pelvic cul-de-sac. It was necessary to proceed with endovaginal exam following the transabdominal exam to visualize the endometrium and adnexa. Color and duplex Doppler ultrasound was utilized to evaluate blood flow to the ovaries. COMPARISON:  No prior non obstetric pelvic sonogram. FINDINGS: Uterus Measurements: 9.7 x 4.3 x 7.4 cm. Anteverted mildly enlarged uterus. No uterine fibroids or other myometrial abnormalities. Endometrium Thickness: 11 mm. Intrauterine device is malpositioned,  located in the lower uterine segment and endocervical canal. No endometrial cavity fluid or focal endometrial mass demonstrated. Right ovary Measurements: 6.4 x 4.1 x 4.3 cm. There is a 4.5 x 3.4 x 3.8 cm complex right ovarian cyst with heterogeneous internal echoes and no internal vascularity, compatible with a right ovarian hemorrhagic cyst. No suspicious right ovarian or right adnexal masses. Left ovary Measurements: 4.0 x 3.9 x 3.0 cm. Normal appearance/no adnexal mass. Pulsed Doppler evaluation of both ovaries demonstrates normal low-resistance arterial and venous waveforms. Other findings There is a moderate volume of nonspecific heterogeneous material distending the pelvic cul-de-sac. IMPRESSION: 1. No evidence of adnexal torsion. 2. Right ovarian 4.5 cm hemorrhagic cyst. 3. Moderate volume nonspecific heterogeneous material distending the pelvic cul-de-sac, favored to represent a hemoperitoneum/blood products probably from recent right ovarian hemorrhagic cyst rupture. 4. Malpositioned IUD, located in the endocervical canal/ lower uterine segment. Alternative means of contraception is necessary until the IUD can be removed and replaced. Electronically Signed   By: Delbert Phenix M.D.   On: 06/18/2017 12:52   US Pelvis Complete  Result Date: 06/18/2017 CLINICAL DATA:  26 year old female with 2 days of generalized pelvic pain and  cramping. IUD placed February 2018. Uncertain LMP. EXAM: TRANSABDOMINAL AND TRANSVAGINAL ULTRASOUND OF PELVIS DOPPLER ULTRASOUND OF OVARIES TECHNIQUE: Both transabdominal and transvaginal ultrasound examinations of the pelvis were performed. Transabdominal technique was performed for global imaging of the pelvis including uterus, ovaries, adnexal regions, and pelvic cul-de-sac. It was necessary to proceed with endovaginal exam following the transabdominal exam to visualize the endometrium and adnexa. Color and duplex Doppler ultrasound was utilized to evaluate blood flow to the  ovaries. COMPARISON:  No prior non obstetric pelvic sonogram. FINDINGS: Uterus Measurements: 9.7 x 4.3 x 7.4 cm. Anteverted mildly enlarged uterus. No uterine fibroids or other myometrial abnormalities. Endometrium Thickness: 11 mm. Intrauterine device is malpositioned, located in the lower uterine segment and endocervical canal. No endometrial cavity fluid or focal endometrial mass demonstrated. Right ovary Measurements: 6.4 x 4.1 x 4.3 cm. There is a 4.5 x 3.4 x 3.8 cm complex right ovarian cyst with heterogeneous internal echoes and no internal vascularity, compatible with a right ovarian hemorrhagic cyst. No suspicious right ovarian or right adnexal masses. Left ovary Measurements: 4.0 x 3.9 x 3.0 cm. Normal appearance/no adnexal mass. Pulsed Doppler evaluation of both ovaries demonstrates normal low-resistance arterial and venous waveforms. Other findings There is a moderate volume of nonspecific heterogeneous material distending the pelvic cul-de-sac. IMPRESSION: 1. No evidence of adnexal torsion. 2. Right ovarian 4.5 cm hemorrhagic cyst. 3. Moderate volume nonspecific heterogeneous material distending the pelvic cul-de-sac, favored to represent a hemoperitoneum/blood products probably from recent right ovarian hemorrhagic cyst rupture. 4. Malpositioned IUD, located in the endocervical canal/ lower uterine segment. Alternative means of contraception is necessary until the IUD can be removed and replaced. Electronically Signed   By: Delbert Phenix M.D.   On: 06/18/2017 12:52   [place] Art/ven Flow Abd Pelv Doppler  Result Date: 06/18/2017 CLINICAL DATA:  26 year old female with 2 days of generalized pelvic pain and cramping. IUD placed February 2018. Uncertain LMP. EXAM: TRANSABDOMINAL AND TRANSVAGINAL ULTRASOUND OF PELVIS DOPPLER ULTRASOUND OF OVARIES TECHNIQUE: Both transabdominal and transvaginal ultrasound examinations of the pelvis were performed. Transabdominal technique was performed for global imaging of  the pelvis including uterus, ovaries, adnexal regions, and pelvic cul-de-sac. It was necessary to proceed with endovaginal exam following the transabdominal exam to visualize the endometrium and adnexa. Color and duplex Doppler ultrasound was utilized to evaluate blood flow to the ovaries. COMPARISON:  No prior non obstetric pelvic sonogram. FINDINGS: Uterus Measurements: 9.7 x 4.3 x 7.4 cm. Anteverted mildly enlarged uterus. No uterine fibroids or other myometrial abnormalities. Endometrium Thickness: 11 mm. Intrauterine device is malpositioned, located in the lower uterine segment and endocervical canal. No endometrial cavity fluid or focal endometrial mass demonstrated. Right ovary Measurements: 6.4 x 4.1 x 4.3 cm. There is a 4.5 x 3.4 x 3.8 cm complex right ovarian cyst with heterogeneous internal echoes and no internal vascularity, compatible with a right ovarian hemorrhagic cyst. No suspicious right ovarian or right adnexal masses. Left ovary Measurements: 4.0 x 3.9 x 3.0 cm. Normal appearance/no adnexal mass. Pulsed Doppler evaluation of both ovaries demonstrates normal low-resistance arterial and venous waveforms. Other findings There is a moderate volume of nonspecific heterogeneous material distending the pelvic cul-de-sac. IMPRESSION: 1. No evidence of adnexal torsion. 2. Right ovarian 4.5 cm hemorrhagic cyst. 3. Moderate volume nonspecific heterogeneous material distending the pelvic cul-de-sac, favored to represent a hemoperitoneum/blood products probably from recent right ovarian hemorrhagic cyst rupture. 4. Malpositioned IUD, located in the endocervical canal/ lower uterine segment. Alternative means of contraception is necessary until  the IUD can be removed and replaced. Electronically Signed   By: Delbert PhenixJason A Poff M.D.   On: 06/18/2017 12:52    Procedures Procedures (including critical care time)  Medications Ordered in ED Medications  ketorolac (TORADOL) injection 60 mg (60 mg Intramuscular  Given 06/18/17 1136)  dicyclomine (BENTYL) capsule 10 mg (10 mg Oral Given 06/18/17 1304)     Initial Impression / Assessment and Plan / ED Course  I have reviewed the triage vital signs and the nursing notes.  Pertinent labs & imaging results that were available during my care of the patient were reviewed by me and considered in my medical decision making (see chart for details).     26 year old female who presents with 2 days of abdominal cramping. No other symptoms. Patient is afebrile, non-toxic appearing, sitting comfortably on examination table. Vital signs reviewed and stable. Patient's last menstrual cycle was 05/21/17. Consider menstrual cycle abdominal cramping versus ovarian pathology. History/physical exam are not concerning for acute infectious etiology such as appendicitis, diverticulitis, colitis. Will check UA and Urine pregnancy for evaluation. Will give analgesics in the department.   UA reviewed. Negative for any signs of infection. Urine pregnancy negative. Discussed results with patient. Ultrasound pending. Pelvic documented above. Wet prep pending.  Reevaluation after ultrasound. Patient had some improvement with Toradol use but is still having some cramping. Requesting some more pain medication. Will give additional analgesics in the department. Ultrasound results pending.  Ultrasound reviewed. Negative for any ovarian torsion. Patient does have a right-sided 4.5 cm hemorrhagic cyst. Also noted is a malpositioned IUD located at the endocervical canal/lower uterine segment. We'll plan to have patient follow-up with her OB/GYN.  Went to discuss results with patient's but she is not in the room. A nurse outpatient walking in the department but does not know where she went. Will plan to await the patient returns room.  Patient is still not returned room. Concerning the patient eloped.   Attempted to call patient on her cell phone listed in the demographic information to  discuss ultrasound results. Patient denies answer the phone. Her voicemail would not allow for any messages to be saved.     Final Clinical Impressions(s) / ED Diagnoses   Final diagnoses:  Abdominal cramping    New Prescriptions There are no discharge medications for this patient.    Maxwell CaulLayden, Tomeko Scoville A, PA-C 06/19/17 1740    Linwood DibblesKnapp, Jon, MD 06/20/17 [phone]

## 2017-06-18 NOTE — ED Triage Notes (Addendum)
Patient reports that about 2 -3 days ago she started to have abdominal cramping starting in her pelvic region and moving up to umbilical area  On bilateral sides of her stomach. Denies any N/V/D - the patient reports that she has had "normal" periods over the last 2 months. Has an IUD in place. The patient reports that her LBM was yesterday "easy to push out"

## 2017-06-18 NOTE — ED Notes (Signed)
Pt ambulatory unassisted, in NAD. 

## 2017-06-18 NOTE — ED Notes (Signed)
ED Provider at bedside. 

## 2017-06-21 LAB — GC/CHLAMYDIA PROBE AMP (~~LOC~~) NOT AT ARMC
CHLAMYDIA, DNA PROBE: NEGATIVE
Neisseria Gonorrhea: NEGATIVE

## 2017-10-06 ENCOUNTER — Encounter (HOSPITAL_BASED_OUTPATIENT_CLINIC_OR_DEPARTMENT_OTHER): Payer: Self-pay

## 2017-10-06 ENCOUNTER — Emergency Department (HOSPITAL_BASED_OUTPATIENT_CLINIC_OR_DEPARTMENT_OTHER)
Admission: EM | Admit: 2017-10-06 | Discharge: 2017-10-06 | Discharge status: Against Medical Advice | Payer: BLUE CROSS/BLUE SHIELD | Attending: Emergency Medicine | Admitting: Emergency Medicine

## 2017-10-06 DIAGNOSIS — N898 Other specified noninflammatory disorders of vagina: Secondary | ICD-10-CM | POA: Insufficient documentation

## 2017-10-06 DIAGNOSIS — Z202 Contact with and (suspected) exposure to infections with a predominantly sexual mode of transmission: Secondary | ICD-10-CM | POA: Diagnosis not present

## 2017-10-06 DIAGNOSIS — Z5320 Procedure and treatment not carried out because of patient's decision for unspecified reasons: Secondary | ICD-10-CM | POA: Insufficient documentation

## 2017-10-06 LAB — URINALYSIS, ROUTINE W REFLEX MICROSCOPIC
Bilirubin Urine: NEGATIVE
GLUCOSE, UA: NEGATIVE mg/dL
Ketones, ur: NEGATIVE mg/dL
Nitrite: NEGATIVE
Protein, ur: NEGATIVE mg/dL
SPECIFIC GRAVITY, URINE: 1.015 (ref 1.005–1.030)
pH: 6.5 (ref 5.0–8.0)

## 2017-10-06 LAB — WET PREP, GENITAL
SPERM: NONE SEEN
Trich, Wet Prep: NONE SEEN
Yeast Wet Prep HPF POC: NONE SEEN

## 2017-10-06 LAB — URINALYSIS, MICROSCOPIC (REFLEX)

## 2017-10-06 LAB — PREGNANCY, URINE: Preg Test, Ur: NEGATIVE

## 2017-10-06 MED ORDER — AZITHROMYCIN 250 MG PO TABS
1000.0000 mg | ORAL_TABLET | Freq: Once | ORAL | Status: AC
  Administered 2017-10-06: 1000 mg via ORAL
  Filled 2017-10-06: qty 4

## 2017-10-06 MED ORDER — CEFTRIAXONE SODIUM 250 MG IJ SOLR
250.0000 mg | Freq: Once | INTRAMUSCULAR | Status: AC
  Administered 2017-10-06: 250 mg via INTRAMUSCULAR
  Filled 2017-10-06: qty 250

## 2017-10-06 NOTE — ED Notes (Signed)
After pelvic, pt put on her clothes and walked out of the room while RN and PA student were still in room.

## 2017-10-06 NOTE — ED Provider Notes (Signed)
[facility] EMERGENCY DEPARTMENT Provider Note   CSN: [id number] Arrival date & time: 10/06/17  1207     History   Chief Complaint Chief Complaint  Patient presents with  . Vaginal Discharge    HPI Karen Zavala is a 26 y.o. female.  Patient presents with, no vaginal discharge, odor 3 days. She had unprotected sex with her partner prior to this. She is concerned that she was exposed to a sexually transmitted infection. She has had some spotting as well. Last menstrual period was a week ago, normal for her. No abdominal pain or pelvic pain. No dysuria or hematuria. No treatments prior to arrival. The onset of this condition was acute. The course is constant. Aggravating factors: none. Alleviating factors: none.        Past Medical History:  Diagnosis Date  . Medical history non-contributory     There are no active problems to display for this patient.   Past Surgical History:  Procedure Laterality Date  . NO PAST SURGERIES      OB History    Gravida Para Term Preterm AB Living   2 1 1     1    SAB TAB Ectopic Multiple Live Births           1       Home Medications    Prior to Admission medications   Not on File    Family History Family History  Problem Relation Age of Onset  . Alcohol abuse Neg Hx     Social History Social History  Substance Use Topics  . Smoking status: Never Smoker  . Smokeless tobacco: Never Used  . Alcohol use No     Allergies   Patient has no known allergies.   Review of Systems Review of Systems  Constitutional: Negative for fever.  HENT: Negative for sore throat.   Eyes: Negative for discharge.  Gastrointestinal: Negative for rectal pain.  Genitourinary: Positive for vaginal bleeding and vaginal discharge. Negative for dysuria, frequency, genital sores and pelvic pain.  Musculoskeletal: Negative for arthralgias.  Skin: Negative for rash.  Hematological: Negative for adenopathy.     Physical  Exam Updated Vital Signs BP 112/66 (BP Location: Left Arm)   Pulse 63   Temp 98.8 F (37.1 C) (Oral)   Resp 16   Ht 5\' 7"  (1.702 m)   Wt 120.2 kg (265 lb)   LMP 09/24/2017   SpO2 98%   BMI 41.50 kg/m   Physical Exam  Constitutional: She appears well-developed and well-nourished.  HENT:  Head: Normocephalic and atraumatic.  Eyes: Conjunctivae are normal. Right eye exhibits no discharge. Left eye exhibits no discharge.  Neck: Normal range of motion. Neck supple.  Cardiovascular: Normal rate, regular rhythm and normal heart sounds.   Pulmonary/Chest: Effort normal and breath sounds normal.  Abdominal: Soft. There is no tenderness.  Genitourinary: Uterus normal. Pelvic exam was performed with patient supine. There is no rash or tenderness on the right labia. There is no rash or tenderness on the left labia. Uterus is not enlarged and not tender. Cervix exhibits no motion tenderness. Right adnexum displays no mass and no tenderness. Left adnexum displays no mass and no tenderness. No erythema in the vagina. No signs of injury around the vagina. Vaginal discharge (pink/white) found.  Neurological: She is alert.  Skin: Skin is warm and dry.  Psychiatric: She has a normal mood and affect.  Nursing note and vitals reviewed.    ED Treatments / Results  Labs (all labs ordered are listed, but only abnormal results are displayed) Labs Reviewed  WET PREP, GENITAL - Abnormal; Notable for the following:       Result Value   Clue Cells Wet Prep HPF POC PRESENT (*)    WBC, Wet Prep HPF POC MANY (*)    All other components within normal limits  PREGNANCY, URINE  HIV ANTIBODY (ROUTINE TESTING)  URINALYSIS, ROUTINE W REFLEX MICROSCOPIC  RPR  GC/CHLAMYDIA PROBE AMP ([place]) NOT AT [facility]    EKG  EKG Interpretation None       Radiology No results found.  Procedures Procedures (including critical care time)  Medications Ordered in ED Medications  cefTRIAXone (ROCEPHIN)  injection 250 mg (250 mg Intramuscular Given 10/06/17 1300)  azithromycin (ZITHROMAX) tablet 1,000 mg (1,000 mg Oral Given 10/06/17 1259)     Initial Impression / Assessment and Plan / ED Course  I have reviewed the triage vital signs and the nursing notes.  Pertinent labs & imaging results that were available during my care of the patient were reviewed by me and considered in my medical decision making (see chart for details).     Patient seen and examined. Patient agrees to STI testing, treatment.  Vital signs reviewed and are as follows: BP 112/66 (BP Location: Left Arm)   Pulse 63   Temp 98.8 F (37.1 C) (Oral)   Resp 16   Ht 5\' 7"  (1.702 m)   Wt 120.2 kg (265 lb)   LMP 09/24/2017   SpO2 98%   BMI 41.50 kg/m   Pelvic exam performed by Lequita Halt PA-S2 with RN under my supervision.  After exam, patient stated that she needed to go back to work right away. She eloped before any blood testing was performed and before any results return. She did received abx for GC/chlamydia.   Final Clinical Impressions(s) / ED Diagnoses   Final diagnoses:  Vaginal discharge   Patient left prior to completion of workup, stating she needed to return to work.  New Prescriptions New Prescriptions   No medications on file     Renne Crigler, Cordelia Poche 10/06/17 1310    Renne Crigler, PA-C 10/06/17 1332    Melene Plan, DO 10/06/17 1421

## 2017-10-06 NOTE — ED Notes (Signed)
Pt sts "I have to go, I don't have time for this right now. I have to get back to work." Pt sts "wanting the pelvic and that's it." Pt refusing lab work at this time.  PA student at bedside for pelvic exam.

## 2017-10-06 NOTE — ED Triage Notes (Signed)
C/o vaginal d/c, odor, burning x 3 days-NAD-steady gait

## 2017-10-06 NOTE — ED Notes (Signed)
PA made aware of what patient stated to this RN.

## 2017-10-07 LAB — GC/CHLAMYDIA PROBE AMP (~~LOC~~) NOT AT ARMC
CHLAMYDIA, DNA PROBE: NEGATIVE
Neisseria Gonorrhea: POSITIVE — AB

## 2017-10-07 IMAGING — US US OB COMP LESS 14 WK
2 series · 15 of 28 positions shown · non-contrast
Comparison: None.

CLINICAL DATA: Pregnant, vaginal bleeding

EXAM:
OBSTETRIC <14 WK US AND TRANSVAGINAL OB US
TECHNIQUE: Both transabdominal and transvaginal ultrasound examinations were
performed for complete evaluation of the gestation as well as the
maternal uterus, adnexal regions, and pelvic cul-de-sac.
Transvaginal technique was performed to assess early pregnancy.

[Series 1: us ob comp less 14 wk · 14 of 87 slices shown (1 of 2)]
[im 1/87]
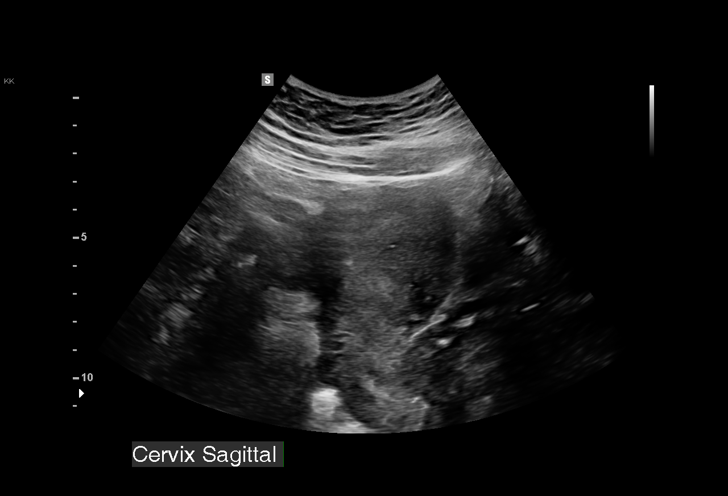
[im 7/87]
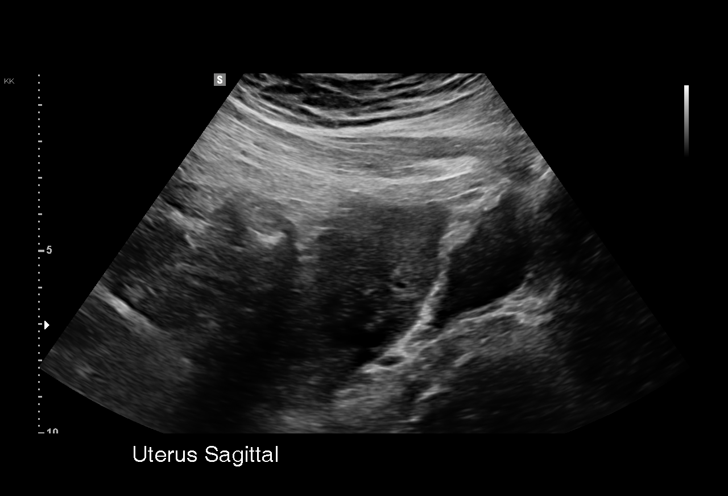
[im 14/87]
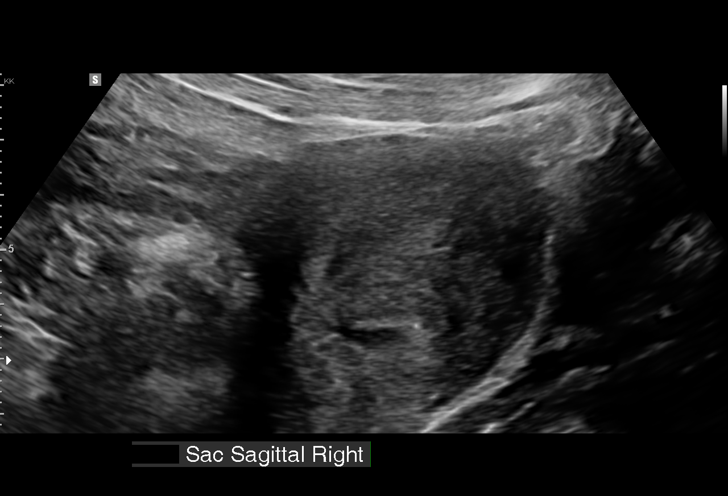
[im 20/87]
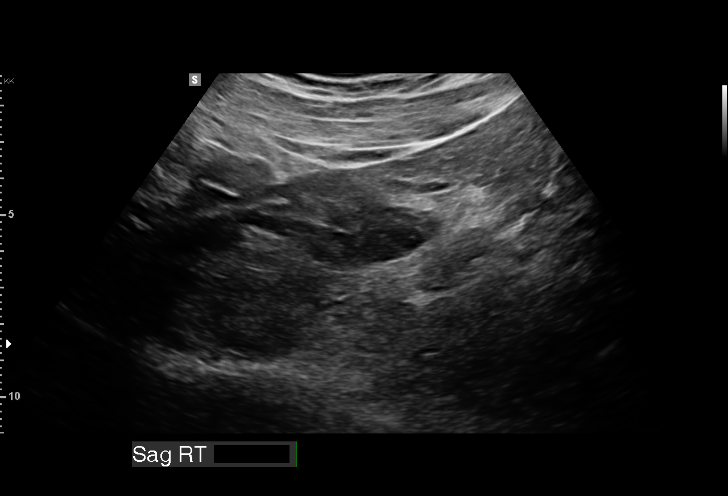
[im 27/87]
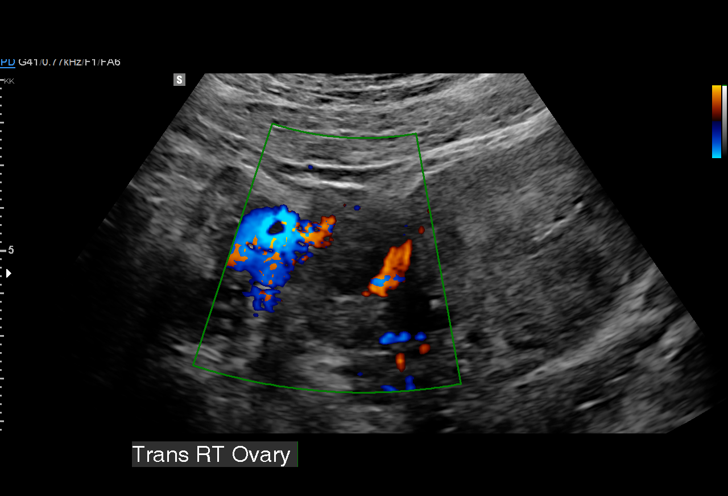
[im 34/87]
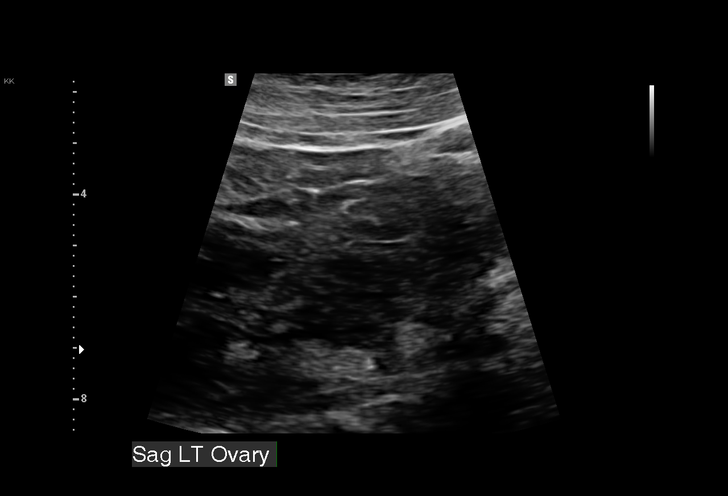
[im 40/87]
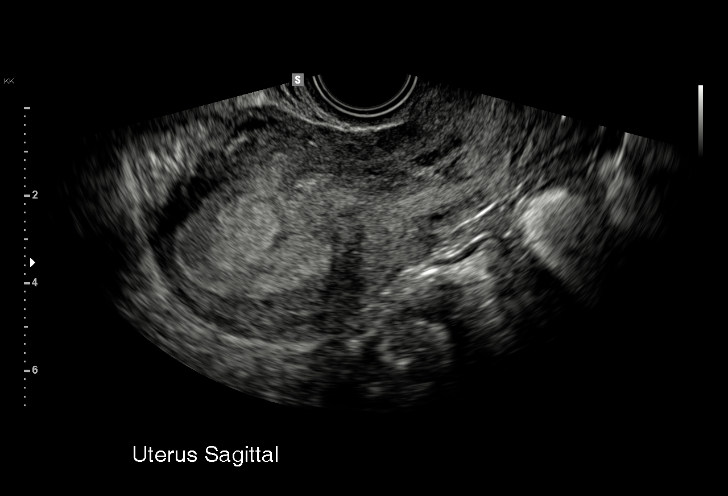
[im 47/87]
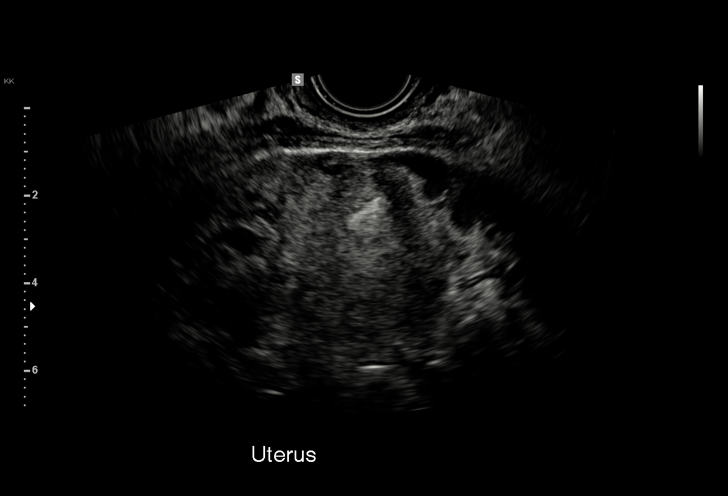
[im 50/87]
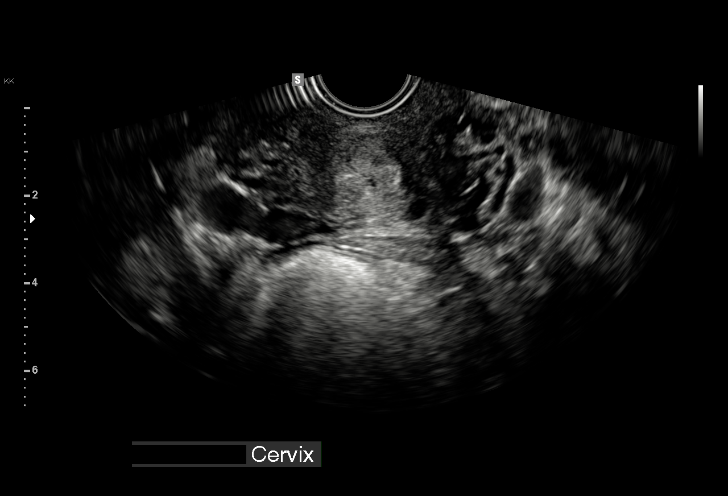
[im 57/87]
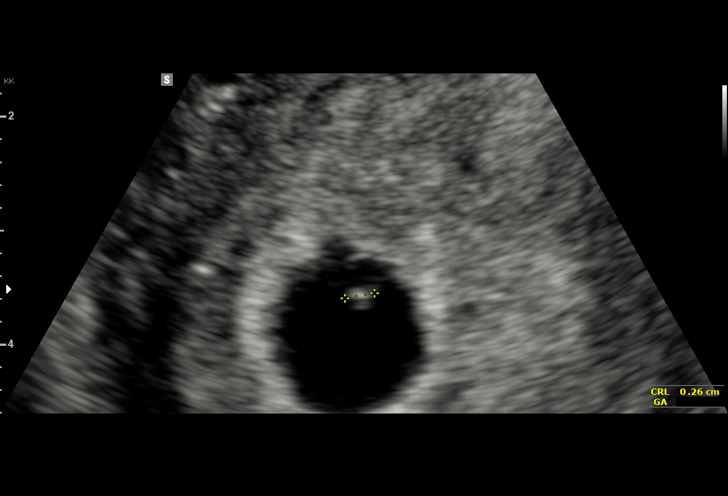
[im 63/87]
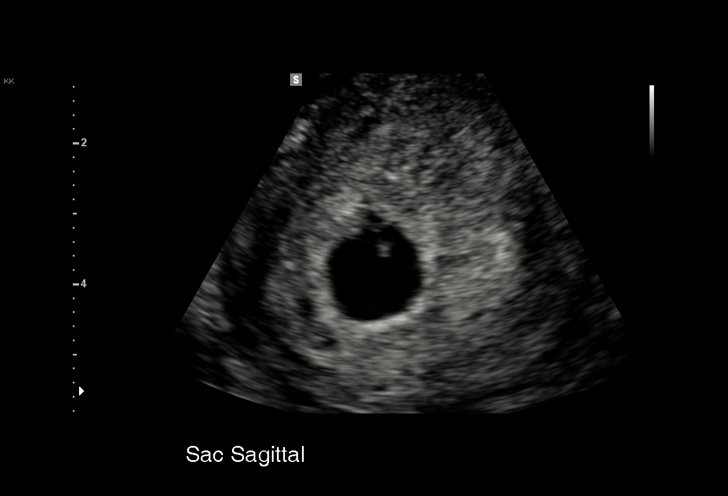
[im 70/87]
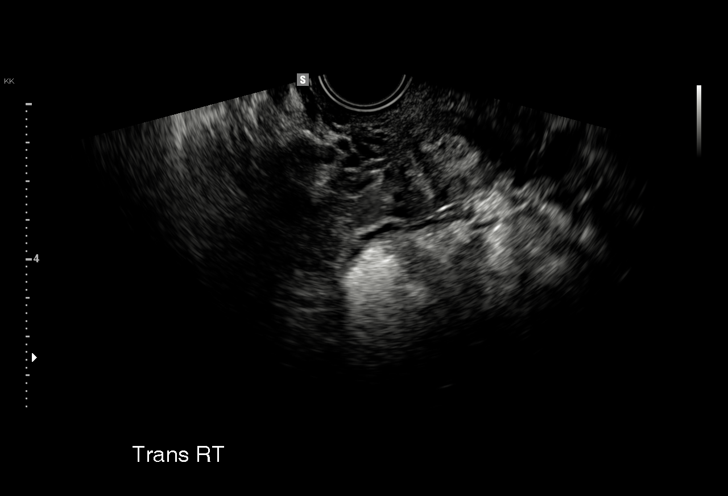
[im 77/87]
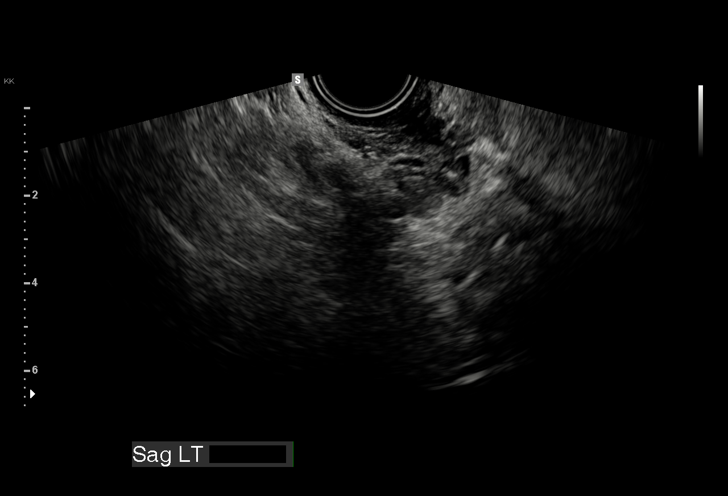
[im 83/87]
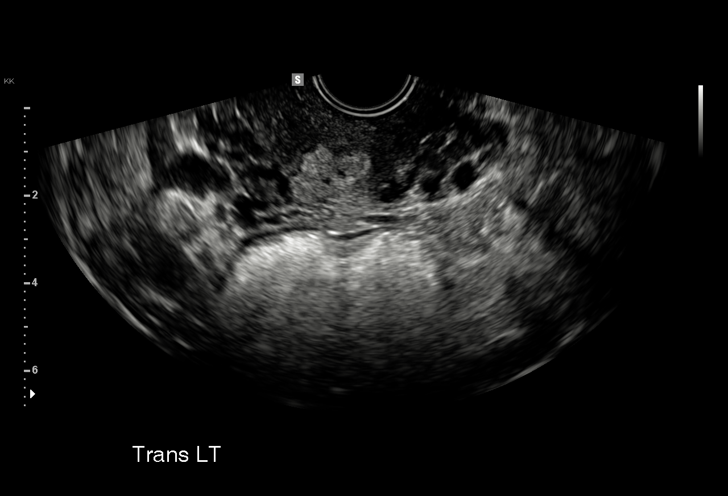

[Series 3: us ob comp less 14 wk · 1 of 1 slices shown (2 of 2)]
[im 1/1]
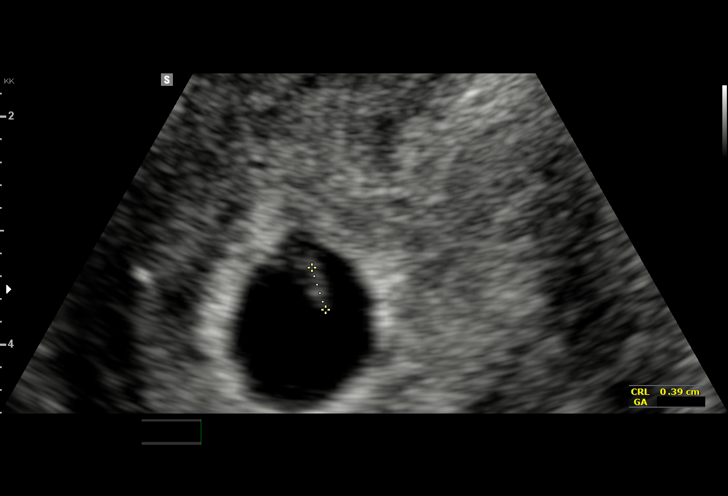

[15 of 28 positions shown; findings below may reference images not displayed]

FINDINGS: Intrauterine gestational sac: Single

Yolk sac:  Present

Embryo:  Present

Cardiac Activity: Present

Heart Rate: 101  bpm

CRL:  3.9  mm   6 w   1 d                  US EDC: 01/11/2017

Subchorionic hemorrhage:  Small subchronic hemorrhage

Maternal uterus/adnexae: Bilateral ovaries are within normal limits,
noting a right corpus luteal cyst.

Trace pelvic fluid.
IMPRESSION: Single live intrauterine gestation, with estimated gestational age 6
weeks 1 day by crown-rump length.

## 2017-12-19 ENCOUNTER — Encounter (HOSPITAL_COMMUNITY): Payer: Self-pay

## 2017-12-19 ENCOUNTER — Other Ambulatory Visit: Payer: Self-pay

## 2017-12-19 ENCOUNTER — Inpatient Hospital Stay (HOSPITAL_COMMUNITY): Payer: BLUE CROSS/BLUE SHIELD

## 2017-12-19 ENCOUNTER — Inpatient Hospital Stay (HOSPITAL_COMMUNITY)
Admission: AD | Admit: 2017-12-19 | Discharge: 2017-12-19 | Disposition: A | Discharge status: Home / Self Care | Payer: BLUE CROSS/BLUE SHIELD | Source: Ambulatory Visit | Attending: Obstetrics & Gynecology | Admitting: Obstetrics & Gynecology

## 2017-12-19 DIAGNOSIS — O23591 Infection of other part of genital tract in pregnancy, first trimester: Secondary | ICD-10-CM | POA: Insufficient documentation

## 2017-12-19 DIAGNOSIS — B9689 Other specified bacterial agents as the cause of diseases classified elsewhere: Secondary | ICD-10-CM | POA: Diagnosis not present

## 2017-12-19 DIAGNOSIS — R109 Unspecified abdominal pain: Secondary | ICD-10-CM | POA: Insufficient documentation

## 2017-12-19 DIAGNOSIS — N760 Acute vaginitis: Secondary | ICD-10-CM

## 2017-12-19 DIAGNOSIS — O3680X0 Pregnancy with inconclusive fetal viability, not applicable or unspecified: Secondary | ICD-10-CM

## 2017-12-19 DIAGNOSIS — O26891 Other specified pregnancy related conditions, first trimester: Secondary | ICD-10-CM | POA: Diagnosis not present

## 2017-12-19 DIAGNOSIS — Z3A01 Less than 8 weeks gestation of pregnancy: Secondary | ICD-10-CM | POA: Insufficient documentation

## 2017-12-19 DIAGNOSIS — O26899 Other specified pregnancy related conditions, unspecified trimester: Secondary | ICD-10-CM

## 2017-12-19 LAB — URINALYSIS, ROUTINE W REFLEX MICROSCOPIC
Bilirubin Urine: NEGATIVE
Glucose, UA: NEGATIVE mg/dL
HGB URINE DIPSTICK: NEGATIVE
Ketones, ur: NEGATIVE mg/dL
NITRITE: NEGATIVE
Protein, ur: NEGATIVE mg/dL
SPECIFIC GRAVITY, URINE: 1.03 (ref 1.005–1.030)
pH: 5 (ref 5.0–8.0)

## 2017-12-19 LAB — WET PREP, GENITAL
Sperm: NONE SEEN
TRICH WET PREP: NONE SEEN
YEAST WET PREP: NONE SEEN

## 2017-12-19 LAB — CBC
HCT: 39.2 % (ref 36.0–46.0)
HEMOGLOBIN: 13.6 g/dL (ref 12.0–15.0)
MCH: 33.2 pg (ref 26.0–34.0)
MCHC: 34.7 g/dL (ref 30.0–36.0)
MCV: 95.6 fL (ref 78.0–100.0)
Platelets: 254 10*3/uL (ref 150–400)
RBC: 4.1 MIL/uL (ref 3.87–5.11)
RDW: 12.6 % (ref 11.5–15.5)
WBC: 12.6 10*3/uL — AB (ref 4.0–10.5)

## 2017-12-19 LAB — POCT PREGNANCY, URINE: Preg Test, Ur: POSITIVE — AB

## 2017-12-19 LAB — HCG, QUANTITATIVE, PREGNANCY: HCG, BETA CHAIN, QUANT, S: 82 m[IU]/mL — AB (ref <5)

## 2017-12-19 MED ORDER — METRONIDAZOLE 0.75 % VA GEL: 1.0000 | Freq: Every day | VAGINAL | 0 refills | Status: AC

## 2017-12-19 NOTE — MAU Provider Note (Signed)
History     CSN: [id number]  Arrival date and time: 12/19/17 2022   First Provider Initiated Contact with Patient 12/19/17 2117      Chief Complaint  Patient presents with  . Abdominal Pain  . Possible Pregnancy  . Vaginal Bleeding   HPI  Ms.  Albertine PatriciaSierra Maslin is a 26 y.o. year old [id number] female at 4857w0d weeks gestation who presents to MAU reporting cramping and spotting since yesterday. She is unsure when her LMP was, but thinks it was 11/25. She thinks she may be pregnant, but does not want to be. If she is pregnant, it is unplanned and she is undecided if it is a desired pregnancy. She took ibuprofen 800 mg @ 1830, but no relief. She lives in [place], but does not want to get her care there; where she delivered her past 2 babies.  Past Medical History:  Diagnosis Date  . Medical history non-contributory     Past Surgical History:  Procedure Laterality Date  . NO PAST SURGERIES      Family History  Problem Relation Age of Onset  . Alcohol abuse Neg Hx     Social History   Tobacco Use  . Smoking status: Never Smoker  . Smokeless tobacco: Never Used  Substance Use Topics  . Alcohol use: No  . Drug use: No    Allergies: No Known Allergies  No medications prior to admission.    Review of Systems  Constitutional: Negative.   HENT: Negative.   Eyes: Negative.   Respiratory: Negative.   Cardiovascular: Negative.   Gastrointestinal: Positive for abdominal pain (cramping).  Endocrine: Negative.   Genitourinary: Positive for pelvic pain (cramping).  Musculoskeletal: Negative.   Skin: Negative.   Allergic/Immunologic: Negative.   Neurological: Negative.   Hematological: Negative.   Psychiatric/Behavioral: Negative.    Physical Exam   Blood pressure 118/75, pulse (!) 115, temperature 98.4 F (36.9 C), temperature source Oral, resp. rate 16, height 5\' 7"  (1.702 m), weight 280 lb (127 kg), last menstrual period 11/14/2017.  Physical Exam  Constitutional:  She is oriented to person, place, and time. She appears well-developed and well-nourished.  HENT:  Head: Normocephalic.  Eyes: Pupils are equal, round, and reactive to light.  Neck: Normal range of motion.  Cardiovascular: Normal rate, regular rhythm and normal heart sounds.  Respiratory: Effort normal and breath sounds normal.  GI: Soft. Bowel sounds are normal.  Genitourinary:  Genitourinary Comments: Uterus: non-tender, cx: smooth, pink, no lesions, moderate amt of thick, white vaginal d/c, closed/long/firm, no CMT or friability, no adnexal tenderness   Musculoskeletal: Normal range of motion.  Neurological: She is alert and oriented to person, place, and time.  Skin: Skin is warm and dry.  Psychiatric: She has a normal mood and affect. Her behavior is normal. Judgment and thought content normal.    MAU Course  Procedures  MDM CCUA UCx UPT Wet Prep GC/CT -- pending HIV -- pending  Results for orders placed or performed during the hospital encounter of 12/19/17 (from the past 24 hour(s))  Urinalysis, Routine w reflex microscopic     Status: Abnormal   Collection Time: 12/19/17  8:24 PM  Result Value Ref Range   Color, Urine YELLOW YELLOW   APPearance CLOUDY (A) CLEAR   Specific Gravity, Urine 1.030 1.005 - 1.030   pH 5.0 5.0 - 8.0   Glucose, UA NEGATIVE NEGATIVE mg/dL   Hgb urine dipstick NEGATIVE NEGATIVE   Bilirubin Urine NEGATIVE NEGATIVE   Ketones,  ur NEGATIVE NEGATIVE mg/dL   Protein, ur NEGATIVE NEGATIVE mg/dL   Nitrite NEGATIVE NEGATIVE   Leukocytes, UA MODERATE (A) NEGATIVE   RBC / HPF 0-5 0 - 5 RBC/hpf   WBC, UA 0-5 0 - 5 WBC/hpf   Bacteria, UA RARE (A) NONE SEEN   Squamous Epithelial / LPF 6-30 (A) NONE SEEN   Mucus PRESENT   Pregnancy, urine POC     Status: Abnormal   Collection Time: 12/19/17  8:48 PM  Result Value Ref Range   Preg Test, Ur POSITIVE (A) NEGATIVE  CBC     Status: Abnormal   Collection Time: 12/19/17  9:21 PM  Result Value Ref Range    WBC 12.6 (H) 4.0 - 10.5 K/uL   RBC 4.10 3.87 - 5.11 MIL/uL   Hemoglobin 13.6 12.0 - 15.0 g/dL   HCT 16.139.2 09.636.0 - 04.546.0 %   MCV 95.6 78.0 - 100.0 fL   MCH 33.2 26.0 - 34.0 pg   MCHC 34.7 30.0 - 36.0 g/dL   RDW 40.912.6 81.111.5 - 91.415.5 %   Platelets 254 150 - 400 K/uL  hCG, quantitative, pregnancy     Status: Abnormal   Collection Time: 12/19/17  9:21 PM  Result Value Ref Range   hCG, Beta Chain, Quant, S 82 (H) <5 mIU/mL  Wet prep, genital     Status: Abnormal   Collection Time: 12/19/17  9:40 PM  Result Value Ref Range   Yeast Wet Prep HPF POC NONE SEEN NONE SEEN   Trich, Wet Prep NONE SEEN NONE SEEN   Clue Cells Wet Prep HPF POC PRESENT (A) NONE SEEN   WBC, Wet Prep HPF POC MODERATE (A) NONE SEEN   Sperm NONE SEEN     [place] Ob Comp Less 14 Wks  Result Date: 12/19/2017 CLINICAL DATA:  Pelvic pain, cramping, and vaginal spotting. Unsure of LMP. EXAM: OBSTETRIC <14 WK US AND TRANSVAGINAL OB US TECHNIQUE: Both transabdominal and transvaginal ultrasound examinations were performed for complete evaluation of the gestation as well as the maternal uterus, adnexal regions, and pelvic cul-de-sac. Transvaginal technique was performed to assess early pregnancy. COMPARISON:  None. FINDINGS: Intrauterine gestational sac: None Maternal uterus/adnexae: Endometrial thickness measures 18 mm. No fibroids identified. Small left ovarian corpus luteum cyst noted. The ovaries are otherwise normal in appearance. No adnexal mass or abnormal free fluid identified. IMPRESSION: Pregnancy of unknown anatomic location (no intrauterine gestational sac or adnexal mass identified). Differential diagnosis includes recent spontaneous abortion, IUP too early to visualize, and non-visualized ectopic pregnancy. Recommend correlation with serial beta-hCG levels, and follow up US as clinically warranted. Electronically Signed   By: Myles RosenthalJohn  Stahl M.D.   On: 12/19/2017 23:28   [place] Ob Transvaginal  Result Date: 12/19/2017 CLINICAL DATA:   Pelvic pain, cramping, and vaginal spotting. Unsure of LMP. EXAM: OBSTETRIC <14 WK US AND TRANSVAGINAL OB US TECHNIQUE: Both transabdominal and transvaginal ultrasound examinations were performed for complete evaluation of the gestation as well as the maternal uterus, adnexal regions, and pelvic cul-de-sac. Transvaginal technique was performed to assess early pregnancy. COMPARISON:  None. FINDINGS: Intrauterine gestational sac: None Maternal uterus/adnexae: Endometrial thickness measures 18 mm. No fibroids identified. Small left ovarian corpus luteum cyst noted. The ovaries are otherwise normal in appearance. No adnexal mass or abnormal free fluid identified. IMPRESSION: Pregnancy of unknown anatomic location (no intrauterine gestational sac or adnexal mass identified). Differential diagnosis includes recent spontaneous abortion, IUP too early to visualize, and non-visualized ectopic pregnancy. Recommend correlation with serial beta-hCG  levels, and follow up US as clinically warranted. Electronically Signed   By: Myles Rosenthal M.D.   On: 12/19/2017 23:28     Assessment and Plan  Pregnancy of unknown anatomic location - Return to [facility] for repeat BHCG - Discussed with patient that HCG levels should double every 48 hrs, so dx and plan of care are dependent on the results of repeat HCG level on 12/22/17 - List of [place] [facility] providers given per pt request  Abdominal cramping affecting pregnancy - May take Tylenol 1000 mg every 6 hrs prn pain - Advised to d/c ibuprofen use - May return to MAU for worsening condition or emergencies  Bacterial vaginitis - Information provided on BV and Metrogel - Rx for Metrogel 0.75% vaginally hs x 5 days  Discharge home Patient verbalized an understanding of the plan of care and agrees.   Raelyn Mora, MSN, CNM 12/19/2017, 9:41 PM

## 2017-12-19 NOTE — MAU Note (Signed)
Pt reports cramping and spotting since yesterday. Unsure of LMP but thinks it was 11/25. Pt thinks she may be pregnant. Pt took ibuprofen 800 mg @ 1830 with minimal relief.

## 2017-12-19 NOTE — Discharge Instructions (Signed)
[facility] Providers  ° ° °[facility]       Phone: [phone] ° °[facility] Phone: [phone] ° °[facility] [place]  Phone: [phone] ° °[facility]  Phone: [phone] ° °[facility] [place]  Phone: [phone] ° °[facility]       Phone: [phone] ° °[facility]    Phone: [phone]  ° °[facility]    Phone: [phone] ° °[facility]    Phone: [phone] ° °[facility]    Phone: [phone] ° °[facility]    Phone: [phone] ° °[facility]       Phone: [phone]  ° °[facility]  Phone: [phone] ° °[facility]    Phone: [phone] ° °[facility]   Phone: [phone] ° °[facility]      Phone: [phone] ° °[facility]    Phone: [phone] ° °

## 2017-12-19 NOTE — MAU Note (Signed)
[facility]. She did not wait for RN to do discharge.

## 2017-12-20 LAB — HIV ANTIBODY (ROUTINE TESTING W REFLEX): HIV Screen 4th Generation wRfx: NONREACTIVE

## 2017-12-20 LAB — GC/CHLAMYDIA PROBE AMP (~~LOC~~) NOT AT ARMC
Chlamydia: NEGATIVE
NEISSERIA GONORRHEA: NEGATIVE

## 2017-12-21 LAB — CULTURE, OB URINE: Special Requests: NORMAL

## 2017-12-22 ENCOUNTER — Telehealth: Payer: Self-pay | Admitting: General Practice

## 2017-12-22 ENCOUNTER — Ambulatory Visit: Payer: BLUE CROSS/BLUE SHIELD

## 2017-12-22 NOTE — Telephone Encounter (Signed)
Patient no showed for stat bhcg appt today. Called patient, no answer- left message stating you missed your scheduled appt today for labs, please call our front office staff so we can get you an appt for tomorrow. Will send certified letter

## 2017-12-24 ENCOUNTER — Ambulatory Visit: Payer: BLUE CROSS/BLUE SHIELD | Admitting: *Deleted

## 2017-12-24 DIAGNOSIS — Z712 Person consulting for explanation of examination or test findings: Secondary | ICD-10-CM

## 2017-12-24 DIAGNOSIS — O3680X0 Pregnancy with inconclusive fetal viability, not applicable or unspecified: Secondary | ICD-10-CM

## 2017-12-24 LAB — HCG, QUANTITATIVE, PREGNANCY: hCG, Beta Chain, Quant, S: 530 m[IU]/mL — ABNORMAL HIGH (ref <5)

## 2017-12-24 NOTE — Progress Notes (Signed)
Pt presented to clinic for stat beta hcg. Stated she is still having spotting and some cramping off and on. Explained to her that she needed to wait on her results so that we could look for an appropriate rise, abnormal results could indicate an ectopic pregnancy or a miscarriage. Understanding voiced.  Results received, discussed with Dr Earlene Plateravis. Order received for u/s to be done at the end of next week. I was unable to schedule the u/s at a time that was convenient for the patient, spoke with Dr Shawnie PonsPratt, who said to have her come back to the clinic in 2 weeks to have her viability scan here. I relayed this to the patient who voiced understanding. Appt will be scheduled at checkout.

## 2017-12-24 NOTE — Progress Notes (Signed)
I have reviewed this chart and agree with the RN/CMA assessment and management.   Appropriate rise in HCG. To return for viability US. Precautions given.Baldemar Lenis.  K. Meryl Ynez Eugenio, M.D. Attending Obstetrician & Gynecologist, [facility], [facility]

## 2018-01-07 ENCOUNTER — Other Ambulatory Visit: Payer: BLUE CROSS/BLUE SHIELD

## 2018-01-07 ENCOUNTER — Telehealth: Payer: Self-pay | Admitting: *Deleted

## 2018-01-07 NOTE — Telephone Encounter (Signed)
Called pt regarding missed appt today @ 0815. I left a message stating that the appt is very important for her health and well-being. I asked that she call back today or Monday at the latest in order to reschedule the appt.

## 2018-02-10 ENCOUNTER — Encounter: Payer: BLUE CROSS/BLUE SHIELD | Admitting: Family Medicine

## 2018-02-10 DIAGNOSIS — Z3492 Encounter for supervision of normal pregnancy, unspecified, second trimester: Secondary | ICD-10-CM

## 2018-07-09 IMAGING — US US PELVIS COMPLETE
1 series · 13 of 25 positions shown · non-contrast
Comparison: No prior non obstetric pelvic sonogram.

CLINICAL DATA: 26-year-old female with 2 days of generalized pelvic
pain and cramping. IUD placed January 2017. Uncertain LMP.

EXAM:
TRANSABDOMINAL AND TRANSVAGINAL ULTRASOUND OF PELVIS
DOPPLER ULTRASOUND OF OVARIES
TECHNIQUE: Both transabdominal and transvaginal ultrasound examinations of the
pelvis were performed. Transabdominal technique was performed for
global imaging of the pelvis including uterus, ovaries, adnexal
regions, and pelvic cul-de-sac.
It was necessary to proceed with endovaginal exam following the
transabdominal exam to visualize the endometrium and adnexa. Color
and duplex Doppler ultrasound was utilized to evaluate blood flow to
the ovaries.

[Series 1: us pelvis complete · 0.22mm/px · 89 acquisitions, 13 frames shown]
[im 1/89]
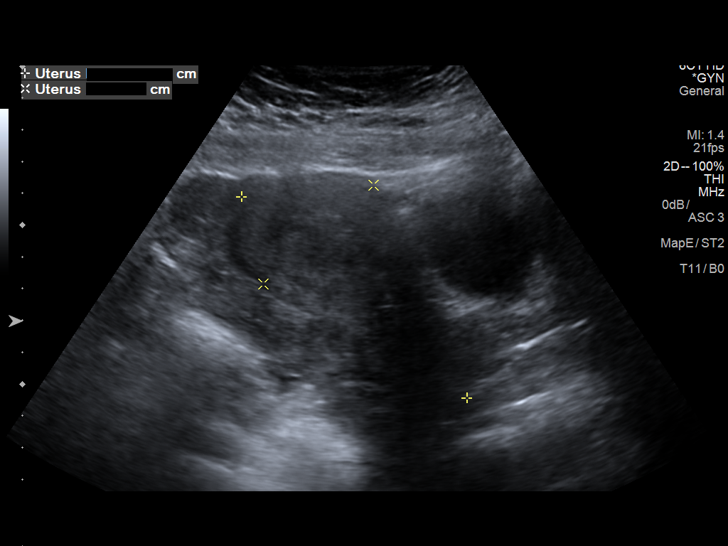
[im 8/89]
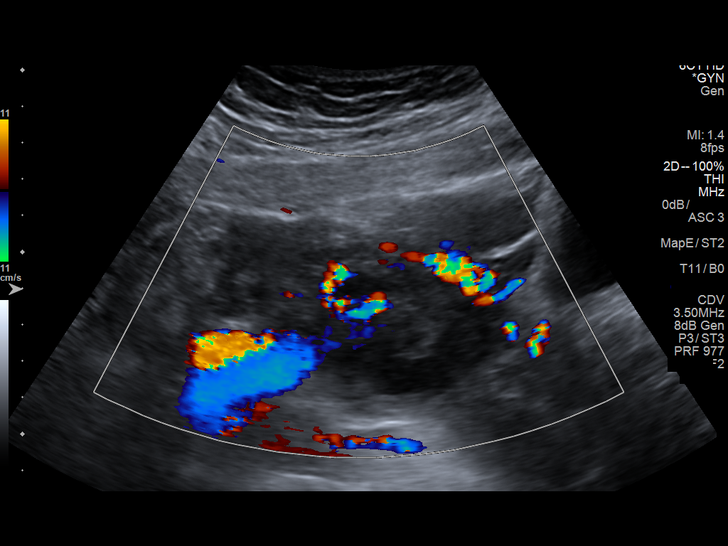
[im 15/89]
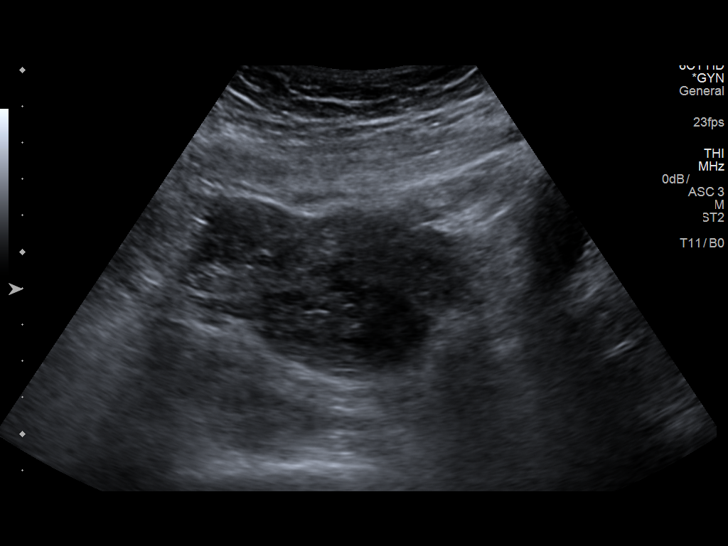
[im 23/89]
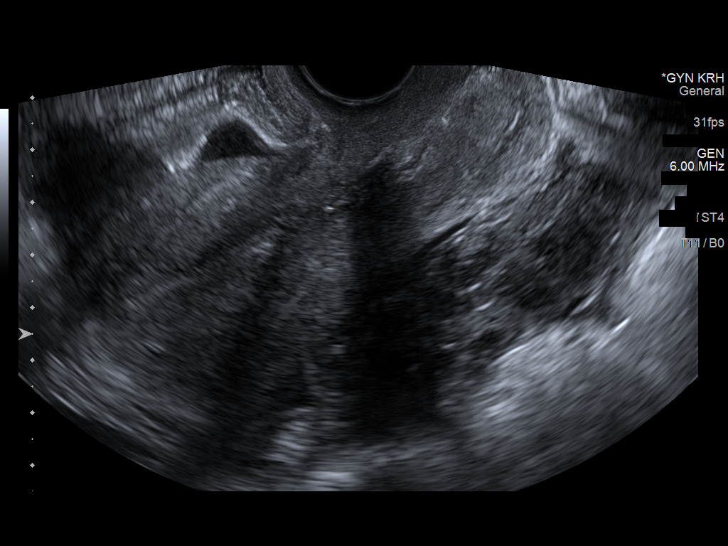
[im 30/89]
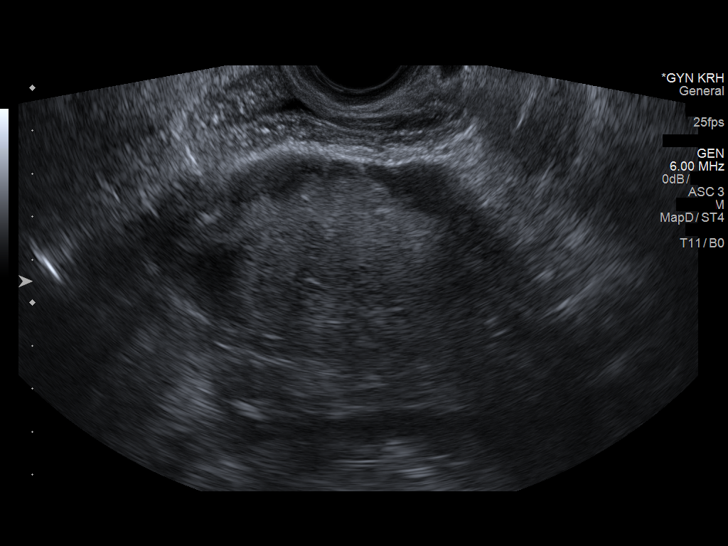
[im 37/89]
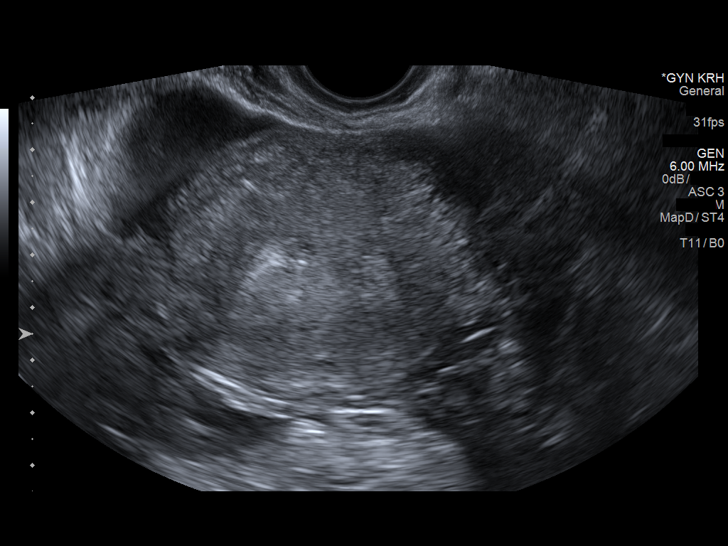
[im 45/89]
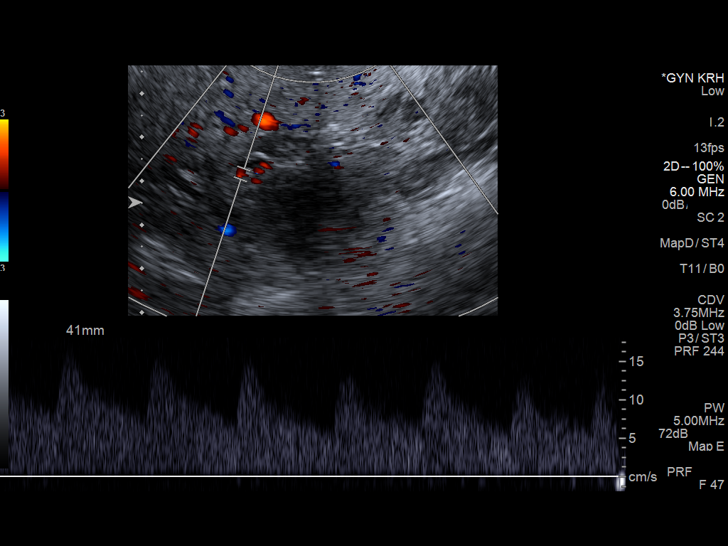
[im 52/89]
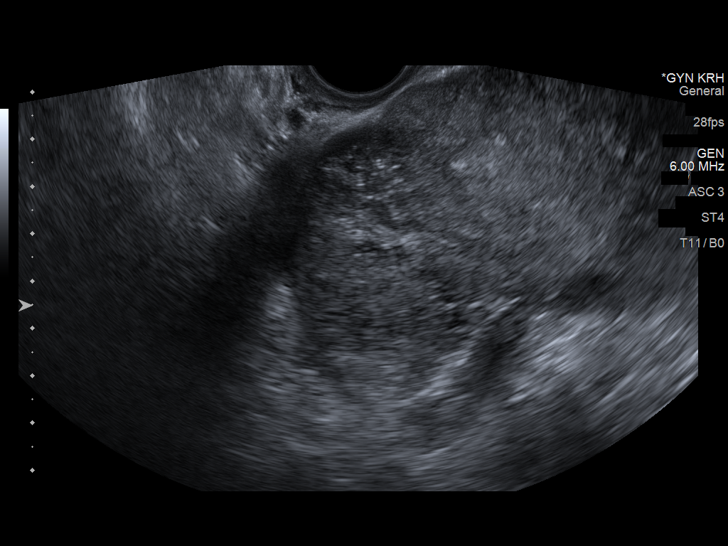
[im 59/89]
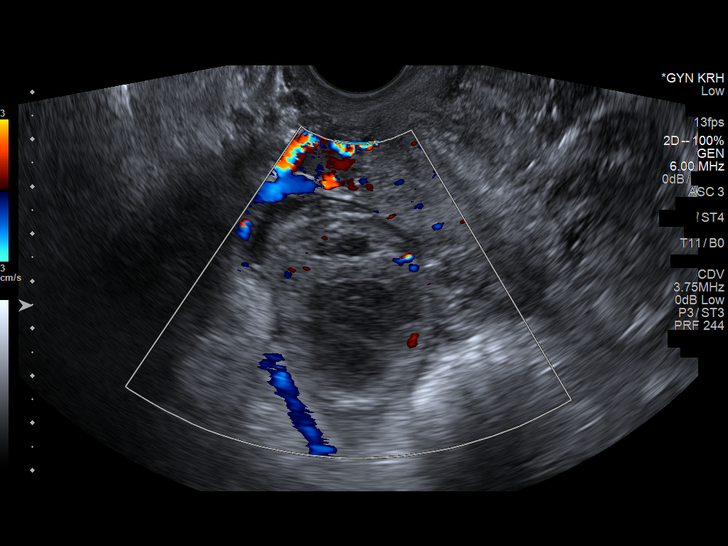
[im 67/89]
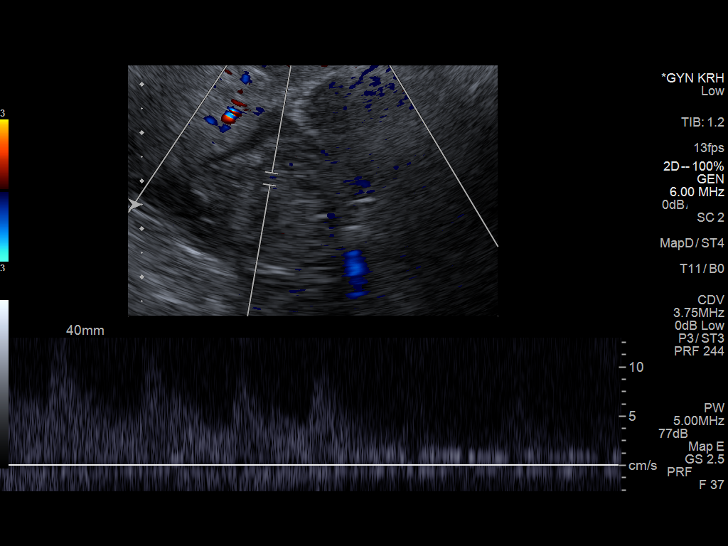
[im 74/89]
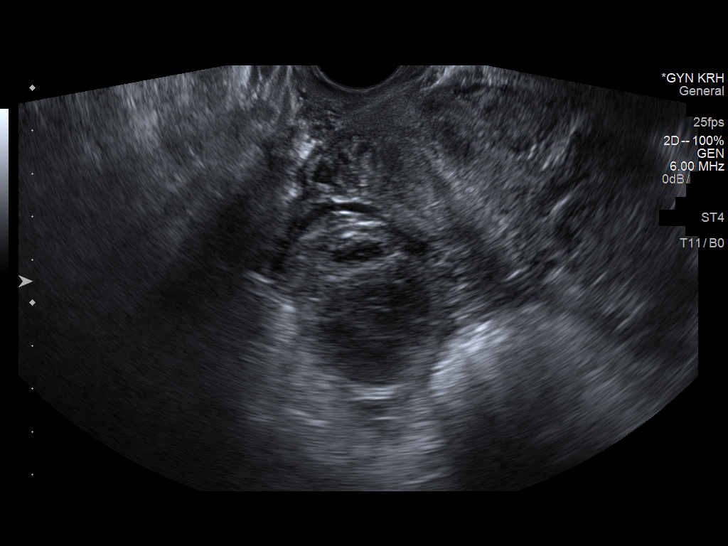
[im 81/89]
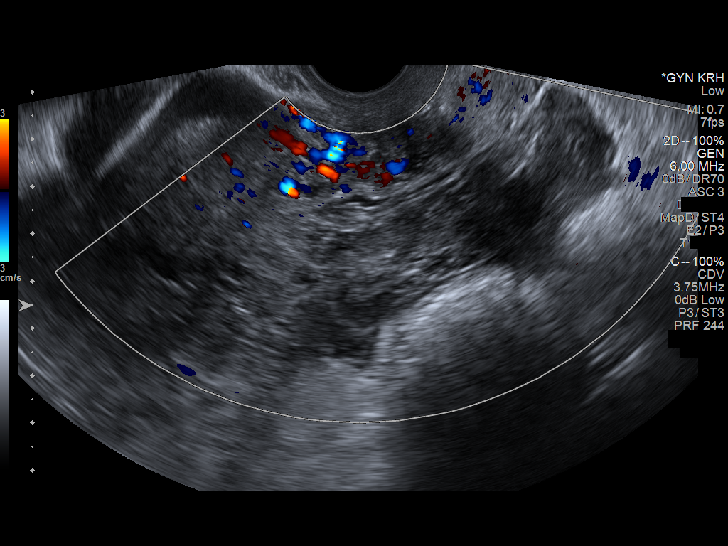
[im 89/89]
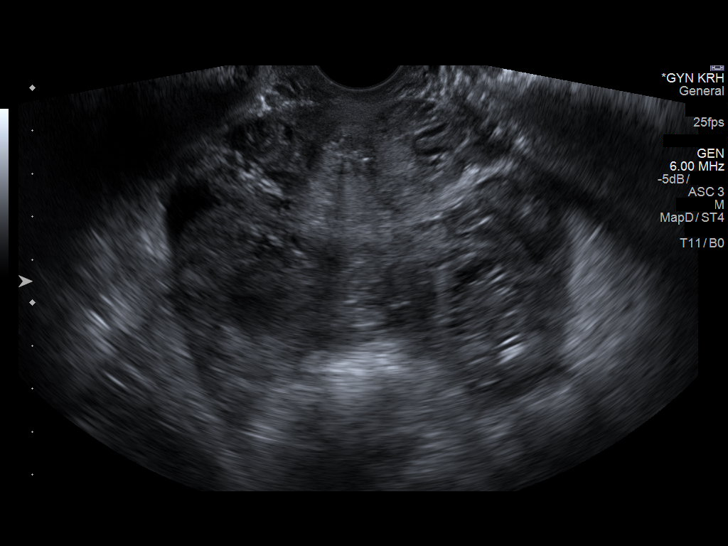

[13 of 25 positions shown; findings below may reference images not displayed]

FINDINGS: Uterus

Measurements: 9.7 x 4.3 x 7.4 cm. Anteverted mildly enlarged uterus.
No uterine fibroids or other myometrial abnormalities.

Endometrium

Thickness: 11 mm. Intrauterine device is malpositioned, located in
the lower uterine segment and endocervical canal. No endometrial
cavity fluid or focal endometrial mass demonstrated.

Right ovary

Measurements: 6.4 x 4.1 x 4.3 cm. There is a 4.5 x 3.4 x 3.8 cm
complex right ovarian cyst with heterogeneous internal echoes and no
internal vascularity, compatible with a right ovarian hemorrhagic
cyst. No suspicious right ovarian or right adnexal masses.

Left ovary

Measurements: 4.0 x 3.9 x 3.0 cm. Normal appearance/no adnexal mass.

Pulsed Doppler evaluation of both ovaries demonstrates normal
low-resistance arterial and venous waveforms.

Other findings

There is a moderate volume of nonspecific heterogeneous material
distending the pelvic cul-de-sac.
IMPRESSION: 1. No evidence of adnexal torsion.
2. Right ovarian 4.5 cm hemorrhagic cyst.
3. Moderate volume nonspecific heterogeneous material distending the
pelvic cul-de-sac, favored to represent a hemoperitoneum/blood
products probably from recent right ovarian hemorrhagic cyst
rupture.
4. Malpositioned IUD, located in the endocervical canal/ lower
uterine segment. Alternative means of contraception is necessary
until the IUD can be removed and replaced.

## 2018-11-13 ENCOUNTER — Encounter (HOSPITAL_COMMUNITY): Payer: Self-pay

## 2019-01-21 ENCOUNTER — Emergency Department (HOSPITAL_BASED_OUTPATIENT_CLINIC_OR_DEPARTMENT_OTHER)
Admission: EM | Admit: 2019-01-21 | Discharge: 2019-01-21 | Disposition: A | Discharge status: Home / Self Care | Payer: BLUE CROSS/BLUE SHIELD | Attending: Emergency Medicine | Admitting: Emergency Medicine

## 2019-01-21 ENCOUNTER — Other Ambulatory Visit: Payer: Self-pay

## 2019-01-21 ENCOUNTER — Encounter (HOSPITAL_BASED_OUTPATIENT_CLINIC_OR_DEPARTMENT_OTHER): Payer: Self-pay | Admitting: Emergency Medicine

## 2019-01-21 DIAGNOSIS — R102 Pelvic and perineal pain: Secondary | ICD-10-CM

## 2019-01-21 DIAGNOSIS — N760 Acute vaginitis: Secondary | ICD-10-CM | POA: Insufficient documentation

## 2019-01-21 DIAGNOSIS — K1379 Other lesions of oral mucosa: Secondary | ICD-10-CM

## 2019-01-21 DIAGNOSIS — B9689 Other specified bacterial agents as the cause of diseases classified elsewhere: Secondary | ICD-10-CM

## 2019-01-21 LAB — WET PREP, GENITAL
Sperm: NONE SEEN
Trich, Wet Prep: NONE SEEN
YEAST WET PREP: NONE SEEN

## 2019-01-21 LAB — URINALYSIS, ROUTINE W REFLEX MICROSCOPIC
BILIRUBIN URINE: NEGATIVE
Glucose, UA: NEGATIVE mg/dL
HGB URINE DIPSTICK: NEGATIVE
Ketones, ur: NEGATIVE mg/dL
Nitrite: NEGATIVE
Protein, ur: NEGATIVE mg/dL
pH: 5.5 (ref 5.0–8.0)

## 2019-01-21 LAB — URINALYSIS, MICROSCOPIC (REFLEX)

## 2019-01-21 LAB — PREGNANCY, URINE: PREG TEST UR: NEGATIVE

## 2019-01-21 MED ORDER — ACYCLOVIR 400 MG PO TABS: 400.0000 mg | ORAL_TABLET | Freq: Three times a day (TID) | ORAL | 0 refills | Status: AC

## 2019-01-21 MED ORDER — METRONIDAZOLE 500 MG PO TABS: 500.0000 mg | ORAL_TABLET | Freq: Two times a day (BID) | ORAL | 0 refills | Status: AC

## 2019-01-21 NOTE — ED Provider Notes (Signed)
[facility] EMERGENCY DEPARTMENT Provider Note   CSN: [id number] Arrival date & time: 01/21/19  1152     History   Chief Complaint Chief Complaint  Patient presents with  . Pelvic Pain  . Oral Pain    HPI Karen Zavala is a 28 y.o. female.  She is presenting with 2 complaints.  First 1 is 3 days of some suprapubic discomfort and some dysuria and frequency.  It is not been associated with any vaginal bleeding or discharge.  She has had a tubal ligation no fevers chills nausea vomiting.  She rates the pain is mild in the pelvis.  She is also complaining of a second issue.  She has had pain in her upper gingiva since yesterday.  She attributes it to giving oral sex.  She said it started the day after that happened.  The history is provided by the patient.  Pelvic Pain  This is a new problem. The current episode started more than 2 days ago. The problem occurs hourly. The problem has not changed since onset.Pertinent negatives include no chest pain, no abdominal pain, no headaches and no shortness of breath. Exacerbated by: urinating. Nothing relieves the symptoms. She has tried nothing for the symptoms. The treatment provided no relief.  Oral Pain  This is a new problem. The current episode started yesterday. The problem occurs constantly. The problem has not changed since onset.Pertinent negatives include no chest pain, no abdominal pain, no headaches and no shortness of breath. Nothing aggravates the symptoms. Nothing relieves the symptoms. She has tried nothing for the symptoms. The treatment provided no relief.    Past Medical History:  Diagnosis Date  . Medical history non-contributory     Patient Active Problem List   Diagnosis Date Noted  . Bacterial vaginitis 12/19/2017  . Pregnancy of unknown anatomic location 12/19/2017    Past Surgical History:  Procedure Laterality Date  . NO PAST SURGERIES    . TUBAL LIGATION       OB History    Gravida  3   Para  1     Term  1   Preterm      AB      Living  1     SAB      TAB      Ectopic      Multiple      Live Births  1            Home Medications    Prior to Admission medications   Not on File    Family History Family History  Problem Relation Age of Onset  . Alcohol abuse Neg Hx     Social History Social History   Tobacco Use  . Smoking status: Never Smoker  . Smokeless tobacco: Never Used  Substance Use Topics  . Alcohol use: No  . Drug use: No     Allergies   Patient has no known allergies.   Review of Systems Review of Systems  Constitutional: Negative for fever.  HENT: Positive for mouth sores. Negative for sore throat.   Eyes: Negative for visual disturbance.  Respiratory: Negative for shortness of breath.   Cardiovascular: Negative for chest pain.  Gastrointestinal: Negative for abdominal pain, nausea and vomiting.  Genitourinary: Positive for dysuria, frequency and pelvic pain. Negative for genital sores, hematuria, vaginal bleeding, vaginal discharge and vaginal pain.  Musculoskeletal: Negative for back pain.  Skin: Negative for rash.  Neurological: Negative for headaches.  Physical Exam Updated Vital Signs BP 110/63 (BP Location: Left Arm)   Pulse 78   Temp 98.4 F (36.9 C) (Oral)   Resp 18   Ht 5\' 7"  (1.702 m)   Wt 131.1 kg   LMP 12/21/2018   SpO2 97%   BMI 45.26 kg/m   Physical Exam Vitals signs and nursing note reviewed. Exam conducted with a chaperone present.  Constitutional:      General: She is not in acute distress.    Appearance: She is well-developed.  HENT:     Head: Normocephalic and atraumatic.     Mouth/Throat:     Mouth: Mucous membranes are moist.     Pharynx: Oropharynx is clear. Posterior oropharyngeal erythema present.     Comments: She is a little bit of gingival erythema on her right upper near her canine tooth.  I do not see any obvious ulcerations. Eyes:     Conjunctiva/sclera: Conjunctivae  normal.  Neck:     Musculoskeletal: Neck supple.  Cardiovascular:     Rate and Rhythm: Normal rate and regular rhythm.     Heart sounds: No murmur.  Pulmonary:     Effort: Pulmonary effort is normal. No respiratory distress.     Breath sounds: Normal breath sounds.  Abdominal:     Palpations: Abdomen is soft.     Tenderness: There is no abdominal tenderness.  Genitourinary:    Labia:        Right: No rash, tenderness, lesion or injury.        Left: No rash, tenderness, lesion or injury.      Vagina: No foreign body. Vaginal discharge present. No erythema, tenderness or bleeding.     Cervix: Normal.     Adnexa:        Right: No mass.         Left: No mass.    Skin:    General: Skin is warm and dry.  Neurological:     General: No focal deficit present.     Mental Status: She is alert.      ED Treatments / Results  Labs (all labs ordered are listed, but only abnormal results are displayed) Labs Reviewed  WET PREP, GENITAL - Abnormal; Notable for the following components:      Result Value   Clue Cells Wet Prep HPF POC PRESENT (*)    WBC, Wet Prep HPF POC MANY (*)    All other components within normal limits  URINALYSIS, ROUTINE W REFLEX MICROSCOPIC - Abnormal; Notable for the following components:   APPearance HAZY (*)    Specific Gravity, Urine >1.030 (*)    Leukocytes, UA SMALL (*)    All other components within normal limits  URINALYSIS, MICROSCOPIC (REFLEX) - Abnormal; Notable for the following components:   Bacteria, UA RARE (*)    All other components within normal limits  PREGNANCY, URINE  GC/CHLAMYDIA PROBE AMP ([place]) NOT AT [facility]    EKG None  Radiology No results found.  Procedures Procedures (including critical care time)  Medications Ordered in ED Medications - No data to display   Initial Impression / Assessment and Plan / ED Course  I have reviewed the triage vital signs and the nursing notes.  Pertinent labs & imaging results that  were available during my care of the patient were reviewed by me and considered in my medical decision making (see chart for details).  Clinical Course as of Jan 21 1901  Sat Jan 21, 2019  1357 Patient's urinalysis was fairly underwhelming.  Proceeded with a pelvic and that was positive for clue cells.  We will cover her with antibiotics for that and also treat with some acyclovir for her concern of her oral pain.   [MB]    Clinical Course User Index [MB] Terrilee Files, MD     Final Clinical Impressions(s) / ED Diagnoses   Final diagnoses:  Pelvic pain in female  BV (bacterial vaginosis)  Acute oral pain    ED Discharge Orders         Ordered    metroNIDAZOLE (FLAGYL) 500 MG tablet  2 times daily     01/21/19 1405    acyclovir (ZOVIRAX) 400 MG tablet  3 times daily     01/21/19 1405           Terrilee Files, MD 01/21/19 [phone]

## 2019-01-21 NOTE — Discharge Instructions (Addendum)
You were seen in the emergency department for some pelvic pain with frequent urination along with pain in your upper gumline.  Your urine did not like an obvious urine infection but you were positive for BV we have started you on an antibiotic for that.  You also had some pain on your upper gums that possibly could be related to an STD.  We are prescribing you acyclovir for that.  It will be important for you to follow-up with your primary care doctor for further evaluation.

## 2019-01-21 NOTE — ED Triage Notes (Signed)
Pt c/o pelvic pain x 3 days with dysuria. Denies vaginal discharge or bleeding. Also reports pain to the upper R gum line.

## 2019-01-23 LAB — GC/CHLAMYDIA PROBE AMP (~~LOC~~) NOT AT ARMC
CHLAMYDIA, DNA PROBE: NEGATIVE
Neisseria Gonorrhea: NEGATIVE

## 2019-05-09 IMAGING — US US OB COMP LESS 14 WK
1 series · 15 of 28 positions shown · non-contrast
Comparison: None.

CLINICAL DATA: Pelvic pain, cramping, and vaginal spotting. Unsure
of LMP.

EXAM:
OBSTETRIC <14 WK US AND TRANSVAGINAL OB US
TECHNIQUE: Both transabdominal and transvaginal ultrasound examinations were
performed for complete evaluation of the gestation as well as the
maternal uterus, adnexal regions, and pelvic cul-de-sac.
Transvaginal technique was performed to assess early pregnancy.

[Series 1: us ob comp less 14 wk · 43 acquisitions, 15 frames shown]
[im 1/43]
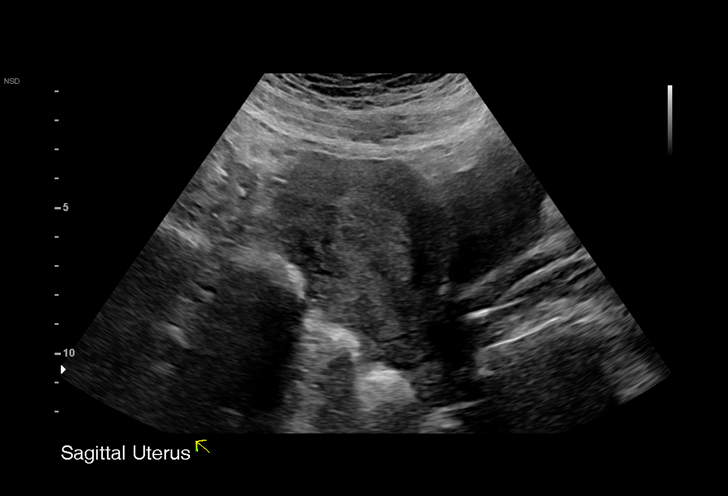
[im 4/43]
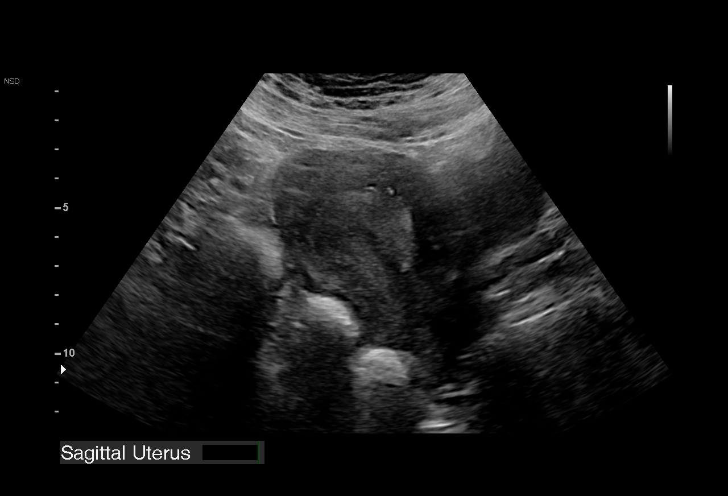
[im 7/43]
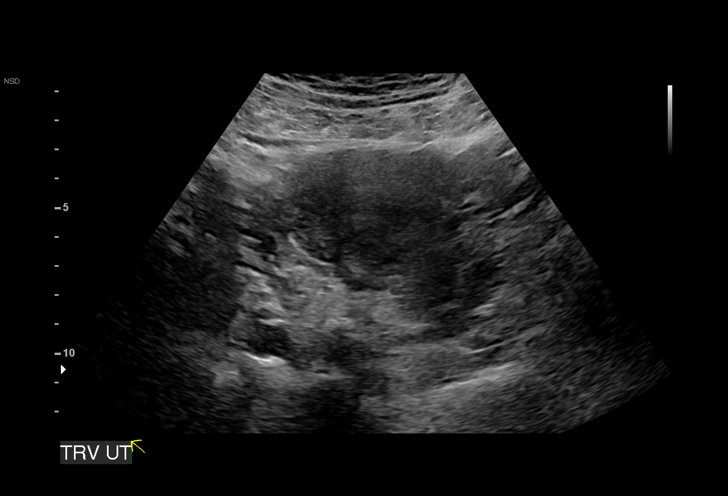
[im 10/43]
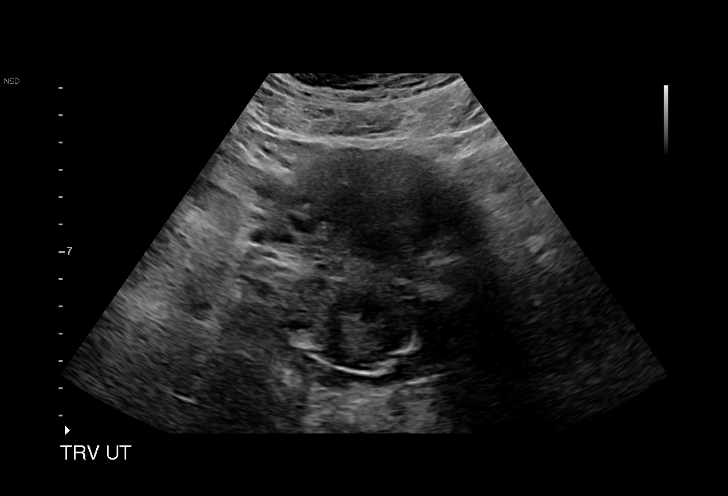
[im 13/43]
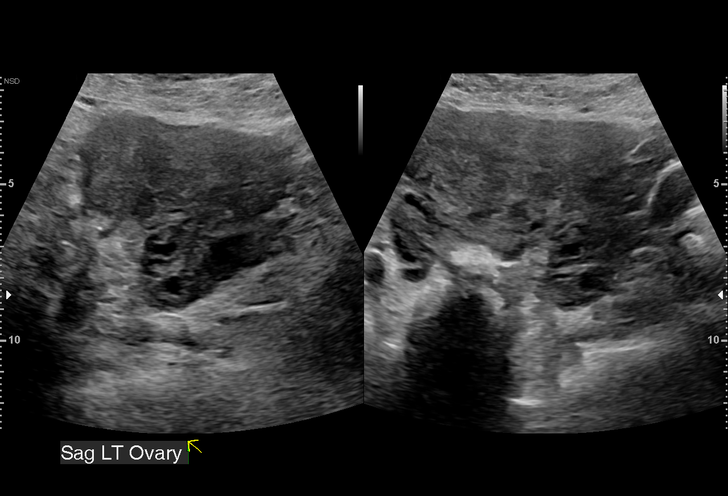
[im 16/43]
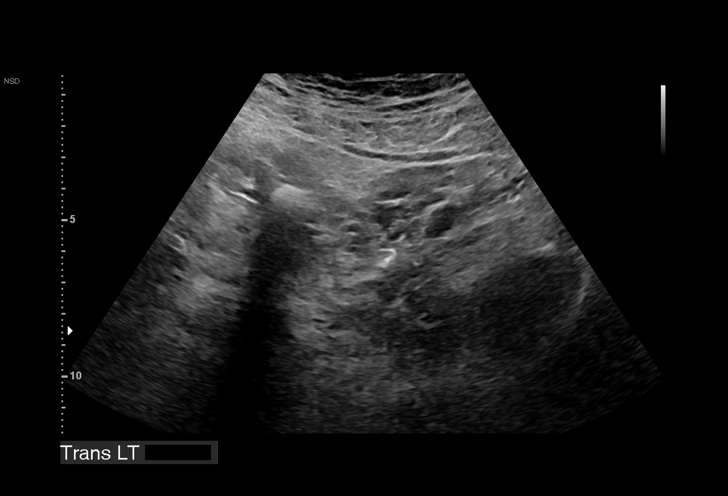
[im 19/43]
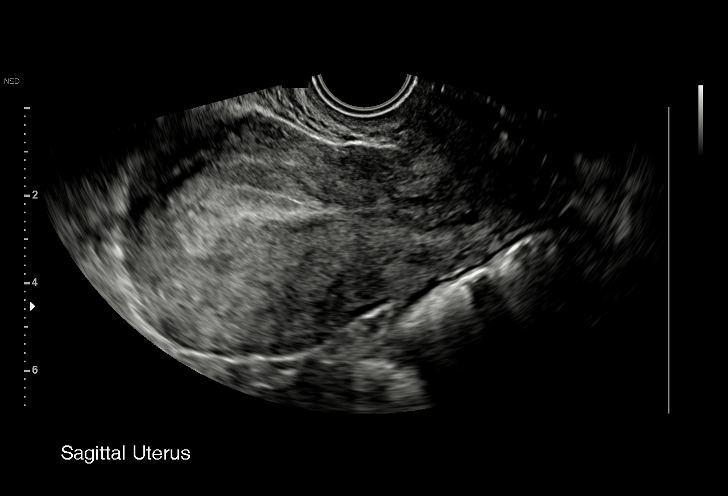
[im 22/43]
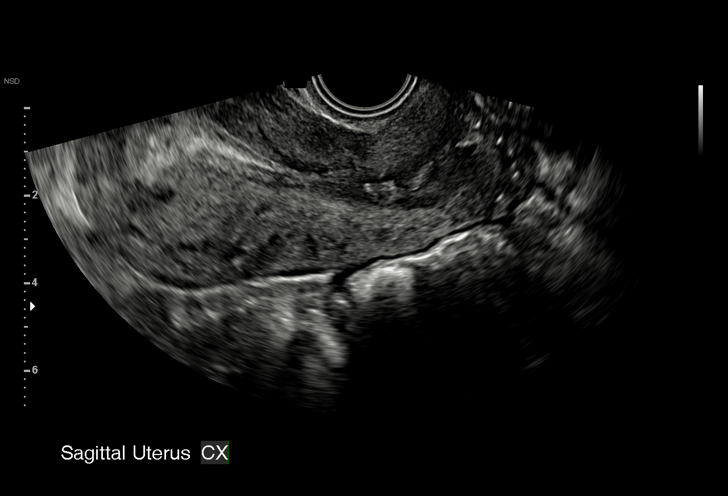
[im 24/43]
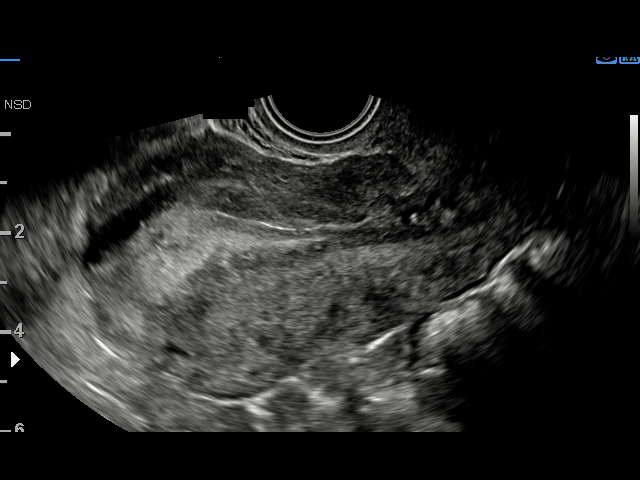
[im 27/43]
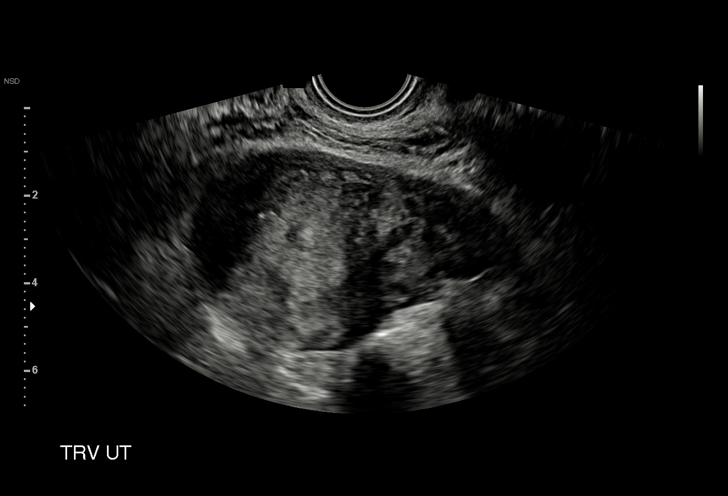
[im 30/43]
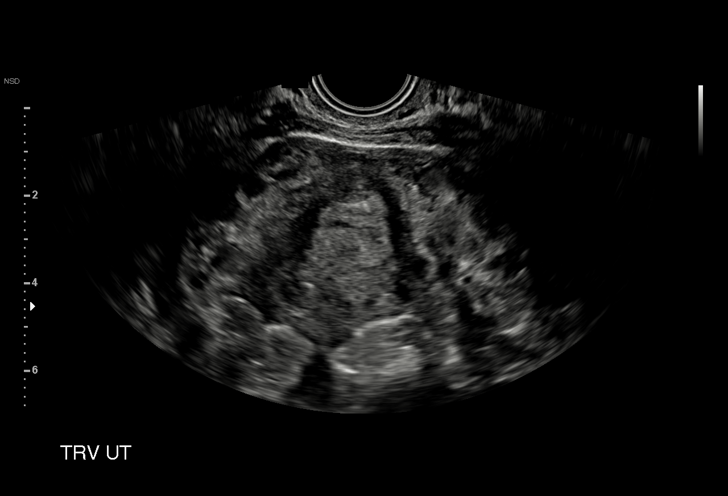
[im 33/43]
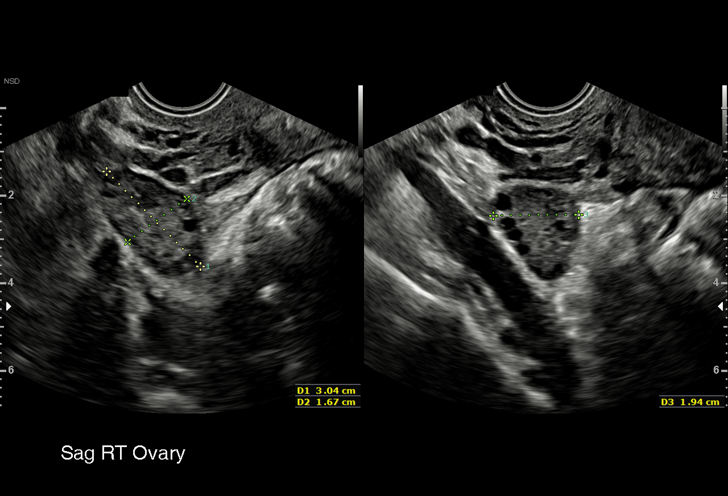
[im 36/43]
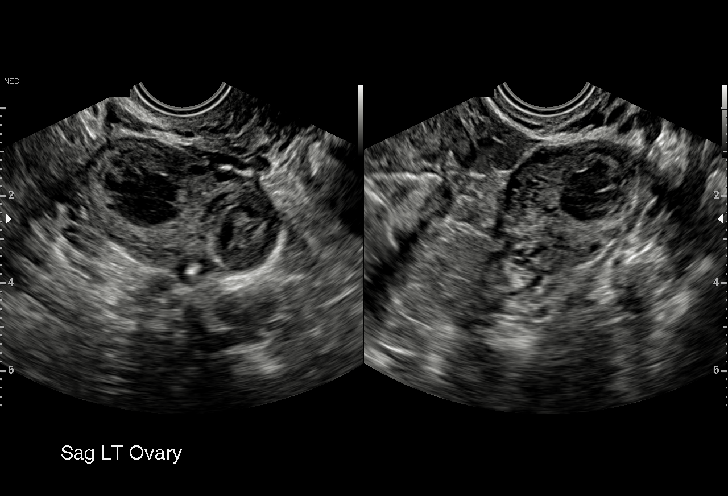
[im 39/43]
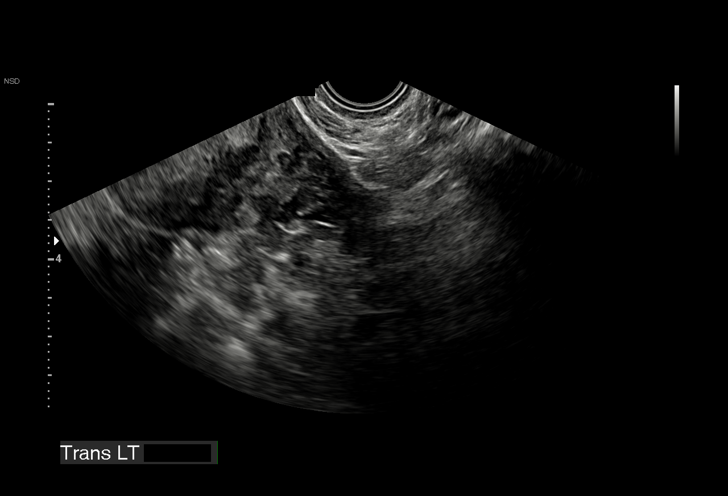
[im 43/43]
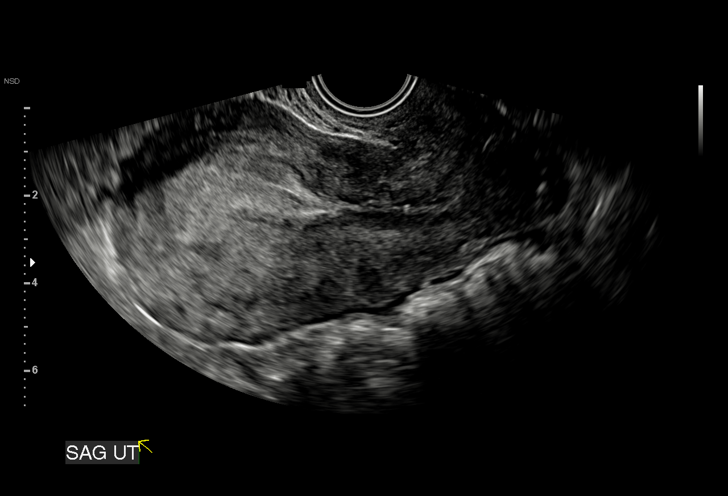

[15 of 28 positions shown; findings below may reference images not displayed]

FINDINGS: Intrauterine gestational sac: None

Maternal uterus/adnexae: Endometrial thickness measures 18 mm. No
fibroids identified.

Small left ovarian corpus luteum cyst noted. The ovaries are
otherwise normal in appearance. No adnexal mass or abnormal free
fluid identified.
IMPRESSION: Pregnancy of unknown anatomic location (no intrauterine gestational
sac or adnexal mass identified). Differential diagnosis includes
recent spontaneous abortion, IUP too early to visualize, and
non-visualized ectopic pregnancy. Recommend correlation with serial
beta-hCG levels, and follow up US as clinically warranted.
